# Patient Record
Sex: Female | Born: 1950
Health system: Western US, Academic
[De-identification: ages and names within clinical notes are randomized; demographics above are authoritative.]

---

## 2004-06-21 DIAGNOSIS — I10 Essential (primary) hypertension: Secondary | ICD-10-CM

## 2004-06-21 DIAGNOSIS — K769 Liver disease, unspecified: Secondary | ICD-10-CM

## 2007-10-31 DIAGNOSIS — R7303 Prediabetes: Secondary | ICD-10-CM

## 2017-01-22 MED ORDER — LISINOPRIL 10 MG PO TABS
ORAL_TABLET | 0 refills
Start: 2017-01-22 — End: ?

## 2019-10-21 DIAGNOSIS — I700 Atherosclerosis of aorta: Secondary | ICD-10-CM

## 2020-05-15 ENCOUNTER — Ambulatory Visit: Payer: PRIVATE HEALTH INSURANCE

## 2020-05-15 DIAGNOSIS — I10 Essential (primary) hypertension: Secondary | ICD-10-CM

## 2020-05-15 DIAGNOSIS — Z1211 Encounter for screening for malignant neoplasm of colon: Secondary | ICD-10-CM

## 2020-05-15 DIAGNOSIS — H04123 Dry eye syndrome of bilateral lacrimal glands: Secondary | ICD-10-CM

## 2020-05-15 DIAGNOSIS — R053 Chronic cough: Secondary | ICD-10-CM

## 2020-05-15 DIAGNOSIS — Z7689 Persons encountering health services in other specified circumstances: Secondary | ICD-10-CM

## 2020-05-15 MED ORDER — ALBUTEROL SULFATE HFA 108 (90 BASE) MCG/ACT IN AERS
1 | RESPIRATORY_TRACT | 0 refills | Status: AC | PRN
Start: 2020-05-15 — End: ?

## 2020-05-15 MED ORDER — LOSARTAN POTASSIUM 25 MG PO TABS
25 mg | ORAL_TABLET | Freq: Every day | ORAL | 0 refills | Status: AC
Start: 2020-05-15 — End: ?

## 2020-05-15 NOTE — Progress Notes
[facility]  Outpatient Follow Up/Progress Note  PMD: Beaulah Dinning., MD  05/15/2020      Chief Complaint   Patient presents with   ??? Establish Care     Requesting fasting labs.    ??? Cough     on and off; mucus; has been with her for years            SUBJECTIVE     Nina Cox is a 70 y.o. female with PMH of HTN, glaucoma who presents for establishing care    # chronic cough  - ''whole life'' off and on  - worse recently  - mucous in throat  - was previously prescribed mucinex, this didn't help  - flonase also helped, does feel dripping sensation in the back of throat  - 30 years ago was seen by a lung specialist, was taking an inhaler at the time, this helped, was told it was a form of asthma    # chronic dry eyes/glaucoma  - following with ophthalmologist, was told to use otc eye drops for dryness  - itching as well as dryness    # HTN  - on losartan 25mg  once daily  - per patient has not checked home reading recently       No other acute concerns      Review of Systems - A complete 14-system review of system was performed with pertinent positives and negatives included above and in the HPI and otherwise reviewed and found to be negative and/or non-contributory.      Past Medical History:   Diagnosis Date   ??? Glaucoma    ??? Hypertension      Past Surgical History:   Procedure Laterality Date   ??? BREAST SURGERY         Medications: reviewed medication list in the chart    Medications that the patient states to be currently taking   Medication Sig   ??? bimatoprost 0.03% ophthalmic drops Place 1 drop into the right eye .   ??? cycloSPORINE, PF, (CEQUA) 0.09 % SOLN Place 1 drop into the right eye .   ??? losartan 25 mg tablet Take 1 tablet (25 mg total) by mouth daily.   ??? [DISCONTINUED] losartan 25 mg tablet Take 1 tablet by mouth.       Allergies: Amlodipine        OBJECTIVE     BP 129/73  ~ Pulse 78  ~ Temp 36.6 ???C (97.8 ???F) (Oral)  ~ Resp 16  ~ Ht 5' 1'' (1.549 m)  ~ Wt 132 lb (59.9 kg)  ~ LMP (LMP Unknown)  ~ SpO2 97%  ~ BMI 24.94 kg/m???     General appearance - alert, well appearing, and in no distress  Eyes - extraocular eye movements intact, conjunctivae injected  Respiratory - clear to auscultation, no wheezes, rales or rhonchi, symmetric air entry  Cardiovascular - normal rate, regular rhythm, normal S1, S2, no murmurs, rubs, clicks or gallops    Labs:  No results found for this or any previous visit.      Imaging:  No results found.        ASSESSMENT AND PLAN:      Nina Cox is a 70 y.o. female who presents for   Chief Complaint   Patient presents with   ??? Establish Care     Requesting fasting labs.    ??? Cough     on and off; mucus; has been  with her for years      1. Encounter to establish care  - CBC; Future  - Comprehensive Metabolic Panel; Future  - Hgb A1c; Future  - Lipid Panel; Future  - TSH with reflex FT4, FT3; Future    2. Chronic cough  - prior CXR from [facility] reviewed (11/2019): Mild bilateral basilar scarring/subsegmental atelectasis. ??? ??? ??? ??? ??? ???   The lungs are otherwise clear. ???No pleural effusions are seen.   - likely component of PND as well as possible reactive airway  - advised flonase and neil med sinus rinses for PND and albuterol for reactive airway  - advised pt to follow up with pulmonology given chronicity  - albuterol 90 mcg/act inhaler; Inhale 1 puff every four (4) hours as needed (shortness of breath or wheezing).  Dispense: 8.5 g; Refill: 0  - Referral to Pulmonary Disease    3. Hypertension, essential  - well controlled from office reading  - advised to resume home BP monitoring as well, goal BP discussed  - losartan 25 mg tablet; Take 1 tablet (25 mg total) by mouth daily.  Dispense: 90 tablet; Refill: 0    4. Chronically dry eyes, bilateral  - Referral to Ophthalmology    5. Screening for colon cancer  - Referral for Colonoscopy Procedure            RHM (to be addressed at annual wellness visit)   Health Maintenance   Topic Date Due   ??? Colorectal Cancer Screening  Never done   ??? Shingles (Shingrix) Vaccine (1 of 2) Never done   ??? Annual Preventive Wellness Visit  Never done   ??? Advance Directive  Never done   ??? Pneumococcal Vaccine (1 of 2 - PCV13) Never done   ??? COVID-19 Vaccine(Tracks primary and booster doses, not sup/immunocomp) (3 - Booster for Pfizer series) 10/28/2019   ??? Statin prescribed for ASCVD Prevention or Treatment  Never done   ??? Influenza Vaccine (Season Ended) 09/06/2020   ??? Breast Ca Screening: MAMMOGRAM  12/06/2021   ??? Tdap/Td Vaccine (2 - Td or Tdap) 06/11/2027   ??? Hepatitis B Screening  Completed   ??? Osteoporosis Early Detection DEXA Scan  Completed   ??? Hepatitis C Screening  Completed       Vaccines:  Immunization History   Administered Date(s) Administered   ??? COVID-19, mRNA, (Pfizer - Purple Cap) 30 mcg/0.3 mL 05/07/2019, 05/28/2019   ??? Hepatitis B, unspecified formulation 07/05/2004   ??? Td 04/14/2007   ??? Tdap 06/10/2017         The above plan of care, diagnosis, orders, and follow-up were discussed with the patient.  Questions related to this recommended plan of care were answered.    Patient Instructions   Please call [phone] to schedule colonoscopy     Nasal rinses, Lloyd Huger Med sinus rinses      Return for nurse visit for blood draw, pt requesting. or sooner PRN    Future Appointments   Date Time Provider Department Center   07/27/2020  4:20 PM Frankey Shown., MD [facility]         Signed,  Ross Ludwig, MD  [facility] Clinical Instructor  Department of Medicine  [place] [facility] [place], [place]  12:41 PM 05/15/2020

## 2020-05-15 NOTE — Patient Instructions
Please call [phone] to schedule colonoscopy    Nasal rinses, Lloyd Huger Med sinus rinses

## 2020-07-27 ENCOUNTER — Ambulatory Visit: Payer: PRIVATE HEALTH INSURANCE

## 2020-07-27 DIAGNOSIS — R0982 Postnasal drip: Secondary | ICD-10-CM

## 2020-07-27 DIAGNOSIS — R918 Other nonspecific abnormal finding of lung field: Secondary | ICD-10-CM

## 2020-07-27 DIAGNOSIS — R053 Chronic cough: Secondary | ICD-10-CM

## 2020-07-27 NOTE — Consults
PULMONARY CLINIC INITIAL CONSULTATION    PATIENT: Nina Cox  MRN: [id number]  DOB: [birth year]  DATE OF SERVICE: 07/27/2020    REFERRING PROVIDER:    REASON FOR CONSULTATION:     HISTORY OF PRESENT ILLNESS:  Nina Cox is a 70 y.o. female    REVIEW OF SYSTEMS:  A 14-point system review of system was performed and abnormal findings not mentioned in the HPI are documented below (check box if abnormal and describe):  [ ]  Constitutional - fever/chills, night sweats, weight loss, fatigue [ ]  GI - abdominal pain, dysphagia, odynophagia, nausea/vomiting, diarrhea, constipation, melena or hematochezia [ ]  Musculoskeletal - arthralgias, joint swelling, myalgias, back pain   [ ]  Eyes - icterus, redness/itchiness, pain, blurry vision or loss [ ]  GU - dysuria, hematuria [ ]  Hematologic - easy bruising, bleeding, lymphadenopathy   [ ]  ENT - congestion, post-nasal drip, epistaxis, oral ulcers, hearing loss, ear pain, facial pain, hoarseness [ ]  Endocrine - polyuria, cold/heart intolerance [ ]  Neurologic - headaches, numbness, weakness, gait imbalance    [ ]  Cardiac - syncope, chest pain, palpitations, orthopnea, paroxysmal nocturnal dyspnea [ ]  Allergy/Immunology - angioedema, hives  [ ]  Psych - anxiety, depression, substance abuse, hallucinations or paranoia   [X]  Respiratory - see HPI [ ]  Skin - rashes, pruritis, nodules, dry skin  [ ]  Extremities - swelling, discoloration, pain       The patient's past medical history, medications, allergies, social history and family history were reviewed and updated on 07/27/2020 as documented below:    PAST MEDICAL HISTORY:  Past Medical History:   Diagnosis Date   ? Glaucoma    ? Hypertension        PAST SURGICAL HISTORY:  Past Surgical History:   Procedure Laterality Date   ? BREAST SURGERY         MEDICATIONS:    Current Outpatient Medications:   ?  albuterol 90 mcg/act inhaler, Inhale 1 puff every four (4) hours as needed (shortness of breath or wheezing)., Disp: 8.5 g, Rfl: 0  ? bimatoprost 0.03% ophthalmic drops, Place 1 drop into the right eye ., Disp: , Rfl:   ?  cycloSPORINE, PF, (CEQUA) 0.09 % SOLN, Place 1 drop into the right eye ., Disp: , Rfl:   ?  losartan 25 mg tablet, Take 1 tablet (25 mg total) by mouth daily., Disp: 90 tablet, Rfl: 0    ALLERGIES:  Allergies   Allergen Reactions   ? Amlodipine      Other reaction(s): Other  Edema        SOCIAL HISTORY:  Social History     Socioeconomic History   ? Marital status: Divorced   Tobacco Use   ? Smoking status: Never Smoker   ? Smokeless tobacco: Never Used   Substance and Sexual Activity   ? Alcohol use: Yes   ? Drug use: No   Other Topics Concern   ? Need Help Feeding Yourself? No   ? Need Help Getting Dressed? No   ? Need Help Using the Telephone? No   ? 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 Need Help Shopping for Groceries? No   ? Need Help Getting Places Beyond Walking Distance? (Bus, Taxi) No   ? Need Help Getting from Bed to Chair? No   ? Need Help Bathing or Showering? No   ? Need Help Taking your Medications? No   ? Need Help Doing Moderately Strenuous Housework? (ex. Laundry) No   ?  Need Help Driving? No   ? Need Help Getting to the Toilet? No   ? Need Help Walking Across the Road? (Includes [place]) No   ? Need Help Preparing Meals? No   ? Need Help Shopping for Personal Items? (Toiletries, Medicines) No   ? Need Help Climbing a Flight of Stairs? No   ? Do you live with someone who assists you at home? No   ? Do you get help from family members or friends in your home? No   ? Do you employ someone to provide health related care or help you in your home? No   ? Do you provide care for a family member? No   ? Does your home have rugs in the hallway? No   ? Does your home have poor lighting? No   ? Does your home lack grab bars in the bathroom? No   ? Does your home lack handrails on the stairs? No   ? Have you noticed any hearing difficulties? No   ? Do you currently participate in any regular activity to improve or maintain your physical fitness? Yes   ? Do you always wear a seatbelt when you ride in a car? Yes   ? If you drink alcohol, do you drink more than 7 drinks per week or more than 3 drinks on any given day? No   ? Has anyone ever been concerned about your drinking? No       FAMILY HISTORY:  Family History   Problem Relation Age of Onset   ? Coronary artery disease Father        PHYSICAL EXAMINATION:  BP 143/79  ~ Pulse 66  ~ LMP  (LMP Unknown)  ~ SpO2 96%     Sleep: Mallampati ***. Epworth Sleepiness Score: ***  General: Well-nourished/Well-developed. Appears stated age. No acute distress.    HEENT: Normocephalic/Atraumatic. Conjunctivae pink. Anicteric sclerae. PERRL, EOMI.  Oral mucosa pink and moist. Posterior pharynx without erythema, exudate or purulence.    Neck: Supple. Trachea midline, No JVD.   Thorax: Normal effort. Clear to auscultation bilaterally. No wheezes, rhonchi or crackles.  Cardiovascular: Regular rate rhythm. No murmurs/rubs/gallops. No heaves. Pulses symmetric bilaterally.  Abdomen: Soft. Bowel sounds present. Non-tender. No rigidity/guarding. No hepatosplenomegaly.  Extremities: No clubbing, cyanosis or edema.  Skin: No lesions or ulcers noted.  Lymphatic: No palpable cervical, axillary or inguinal lymph nodes. No evidence of lymphedema.  Neurologic: Alert and Oriented x 4. CN II-XII intact. Motor Strength grossly normal and symmetric throughout.   Psychiatric: Well-groomed. Mood pleasant. Affect congruent. Speech fluent.     RADIOLOGICAL STUDIES:  LDCT chest 10/03/17 - reviewed outside report:  1. ?6.6 cm soft tissue ?mass posterior to the left chest wall implant   with partial ?calcification of unclear clinical significance. ? ? ? ?   Asymmetric soft tissue ?tissue stranding surrounding the right breast   implant of unclear ?clinical significance. Further evaluation with ? ?   dedicated breast imaging ?is recommended. ? ? ? ? ? ? ? ? ? ? ? ? ? ?   2. ?Patchy densities in bilateral lower lung zones ?of unclear ? ? ? ?   clinical significance. I favor subsegmental atelectasis but ? ? ? ? ?   underlying infiltrates are difficult to exclude. ? ? ? ? ? ? ? ? ? ? ?   3. ? Atherosclerotic vascular disease. ?    CARDIAC STUDIES:  None.    PULMONARY FUNCTION TESTS:  None.    IMPRESSIONS:   Nina Cox is a 70 y.o. female    RECOMMENDATIONS:    -Will refer to ENT  -Will repeat PFT's prior to next visit  -Will repeat CT chest prior to next visit given abnormalities on prior outside imaging  -Encouraged regular physical activity  -Discussed infection prevention    Immunization History   Administered Date(s) Administered   ? COVID-19, mRNA, (Pfizer - Purple Cap) 30 mcg/0.3 mL 05/07/2019, 05/28/2019   ? Hepatitis B, unspecified formulation 07/05/2004   ? Td 04/14/2007   ? Tdap 06/10/2017       RTC in 3 months.     Previous labs, imaging, and procedures, if listed above, were reviewed.  Time spent in today?s visit was ** minutes of which at least 50% of which was in education, counseling, and care coordination.    Delana Meyer. Latanya Maudlin, M.D., M.S.  Assistant Clinical Professor  Division of Pulmonary & Critical Care Medicine  [employer] [facility]  Pager # [phone]

## 2020-08-22 ENCOUNTER — Inpatient Hospital Stay: Payer: PRIVATE HEALTH INSURANCE

## 2020-08-22 DIAGNOSIS — R918 Other nonspecific abnormal finding of lung field: Secondary | ICD-10-CM

## 2020-08-30 ENCOUNTER — Ambulatory Visit: Payer: PRIVATE HEALTH INSURANCE

## 2020-08-30 DIAGNOSIS — H401130 Primary open-angle glaucoma, bilateral, stage unspecified: Secondary | ICD-10-CM

## 2020-11-02 ENCOUNTER — Ambulatory Visit: Payer: PRIVATE HEALTH INSURANCE

## 2020-11-09 ENCOUNTER — Ambulatory Visit: Payer: PRIVATE HEALTH INSURANCE

## 2020-12-17 ENCOUNTER — Ambulatory Visit: Payer: PRIVATE HEALTH INSURANCE

## 2020-12-26 ENCOUNTER — Ambulatory Visit: Payer: PRIVATE HEALTH INSURANCE

## 2020-12-26 DIAGNOSIS — G4733 Obstructive sleep apnea (adult) (pediatric): Secondary | ICD-10-CM

## 2020-12-26 DIAGNOSIS — R197 Diarrhea, unspecified: Secondary | ICD-10-CM

## 2020-12-26 DIAGNOSIS — H40003 Preglaucoma, unspecified, bilateral: Secondary | ICD-10-CM

## 2020-12-26 DIAGNOSIS — L659 Nonscarring hair loss, unspecified: Secondary | ICD-10-CM

## 2020-12-26 NOTE — Progress Notes
[facility]  Outpatient Progress Note  PMD: Beaulah Dinning., MD  12/26/2020      Chief Complaint   Patient presents with   ? Diarrhea     Pt have diarrhea       Subjective:      Nina Cox is a 70 y.o. female with PMH of *** who presents for     (1) diarrhea  - going on for 2 mo  - has tried otc meds and hasn't improved  - some days it's bad, all day and all long, can't make it to the bathroom  - endorses mal odor  - denies blood in stool    (2). OSA?  - had plastic surg last week and was told has possible osa (mini face lift)   - stops breathing at times.   - was also told has hernia on abd, (harvested fat)     (3). Hair thinning  - worse int he past 6 mo         No other acute concerns    Objective (click to expand/collapse)     Review of Systems - A complete 14-system review of system was performed with pertinent positives and negatives included above and in the HPI and otherwise reviewed and found to be negative and/or non-contributory.      Past Medical History:   Diagnosis Date   ? Glaucoma    ? Hypertension      Past Surgical History:   Procedure Laterality Date   ? BREAST SURGERY         Medications: {meds:317282::''reviewed medication list in the chart''}  No outpatient medications have been marked as taking for the 12/26/20 encounter (Office Visit) with Sibyl Parr, DO, MS.       Allergies: Amlodipine        Objective:      BP (!) 182/84  ~ Pulse 73  ~ Temp 36.7 ?C (98 ?F) (Oral)  ~ Ht 5' 1'' (1.549 m)  ~ LMP  (LMP Unknown)  ~ SpO2 96%  ~ BMI 24.94 kg/m?     General appearance - alert, well appearing, and in no distress  Eyes - pupils equal and reactive, extraocular eye movements intact, conjunctivae not injected  Ears - bilateral TM's and external ear canals normal  Nose - normal and patent, no erythema, discharge or polyps  Mouth - mucous membranes moist, pharynx normal without lesions, uvula midline  Neck - supple, no significant adenopathy  Respiratory - clear to auscultation, no wheezes, rales or rhonchi, symmetric air entry  Cardiovascular - normal rate, regular rhythm, normal S1, S2, no murmurs, rubs, clicks or gallops.   Gastrointestinal - soft, nontender, nondistended, no masses or organomegaly  Back exam - {back exam:315940::''full range of motion, no tenderness, palpable spasm or pain on motion''}  Musculoskeletal - {msk exam:315950::''no joint tenderness, deformity or swelling''}  Extremities - peripheral pulses normal, no pedal edema, no clubbing or cyanosis  Skin - normal coloration and turgor, no rashes, no suspicious skin lesions noted  Neurological - alert, oriented, normal speech, no focal findings or movement disorder noted, normal gait   Psych - mood and affect appropriate     Labs:  No results found for this or any previous visit.      Imaging:  No results found.         ASSESSMENT and PLAN:      Nina Cox is a 70 y.o. female who presents for   Chief  Complaint   Patient presents with   ? Diarrhea     Pt have diarrhea         Problem List Items Addressed This Visit    None              RHM (to be addressed at annual wellness visit)   Health Maintenance   Topic Date Due   ? Colorectal Cancer Screening  Never done   ? Shingles (Shingrix) Vaccine (1 of 2) Never done   ? Annual Preventive Wellness Visit  Never done   ? Advance Directive  Never done   ? Pneumococcal Vaccine (1 - PCV) Never done   ? COVID-19 Vaccine(Tracks primary and booster doses, not sup/immunocomp) (3 - Booster for Pfizer series) 07/23/2019   ? Influenza Vaccine (1) Never done   ? Statin prescribed for ASCVD Prevention or Treatment  Never done   ? Breast Ca Screening: MAMMOGRAM  12/06/2021   ? Tdap/Td Vaccine (2 - Td or Tdap) 06/11/2027   ? Hepatitis B Screening  Completed   ? Osteoporosis Early Detection DEXA Scan  Completed   ? Hepatitis C Screening  Completed       Vaccines:  Immunization History   Administered Date(s) Administered   ? COVID-19, mRNA, (Pfizer - Purple Cap) 30 mcg/0.3 mL 05/07/2019, 05/28/2019   ? Hepatitis B, unspecified formulation 07/05/2004   ? Td 04/14/2007   ? Tdap 06/10/2017         The above plan of care, diagnosis, orders, and follow-up were discussed with the patient.  Questions related to this recommended plan of care were answered.        No follow-ups on file. or sooner PRN    No future appointments.      Signed,  Sibyl Parr, DO, MS  [facility] Clinical Assistant Professor, Family Medicine  Department of Medicine  [place] [facility] [place], [place]

## 2020-12-27 ENCOUNTER — Ambulatory Visit: Payer: PRIVATE HEALTH INSURANCE

## 2020-12-28 LAB — Parasite Enteric Pathogen Panel: ENTAMOEBA HISTOLYTICA PCR: NOT DETECTED

## 2020-12-29 LAB — Bacterial Enteric Pathogen Panel: SHIGA TOXIN: NOT DETECTED

## 2021-01-02 ENCOUNTER — Telehealth: Payer: PRIVATE HEALTH INSURANCE

## 2021-01-02 NOTE — Telephone Encounter
-----   Message from Sibyl Parr, DO, MS sent at 01/01/2021  6:03 PM PST -----  Please inform pt stool tests were normal.     Schedule with pcp or available  provider, if pt is still having symptoms.     Dr. Nemiah Commander

## 2021-01-02 NOTE — Telephone Encounter
Called spoke to patient message was given. rossy ma

## 2021-01-16 ENCOUNTER — Ambulatory Visit: Payer: PRIVATE HEALTH INSURANCE

## 2021-01-16 DIAGNOSIS — N2889 Other specified disorders of kidney and ureter: Secondary | ICD-10-CM

## 2021-01-16 NOTE — Progress Notes
REASON FOR CONSULTATION:  Chronic cough    REFERRING PHYSICIAN:  No ref. provider found    HISTORY OF PRESENT ILLNESS:  Nina Cox is a 71 y.o. female with hypertension, glaucoma    01/25/2021:  He was previously placed on a trial of albuterol.     REVIEW OF SYSTEMS:  14 point review of systems was performed which was negative with the exception of that noted in the HPI above.    ALLERGIES:  Allergies   Allergen Reactions   ? Amlodipine      Other reaction(s): Other  Edema        Patient Active Problem List   Diagnosis   ? Essential hypertension   ? Hyperlipidemia   ? Prediabetes   ? Nevus of choroid   ? Insufficiency, tear film   ? Chronic nonalcoholic liver disease   ? Primary open angle glaucoma (POAG) of both eyes   ? Hardening of the aorta (main artery of the heart) (HCC/RAF)   ? Abnormal tomography of chest       Past Medical History:   Diagnosis Date   ? Glaucoma    ? Hypertension        Past Surgical History:   Procedure Laterality Date   ? BREAST SURGERY         Past Surgical History:   Procedure Laterality Date   ? BREAST SURGERY         CURRENT MEDICATIONS:    Current Outpatient Medications:   ?  albuterol 90 mcg/act inhaler, Inhale 1 puff every four (4) hours as needed (shortness of breath or wheezing)., Disp: 8.5 g, Rfl: 0  ?  amoxicillin 500 mg tablet, TAKE 1 TABLET BY MOUTH THREE TIMES DAILY UNTIL GONE (Patient not taking: Reported on 01/16/2021.), Disp: , Rfl:   ?  bimatoprost 0.03% ophthalmic drops, Place 1 drop into the right eye. (Patient not taking: Reported on 01/16/2021.), Disp: , Rfl:   ?  cycloSPORINE, PF, (CEQUA) 0.09 % SOLN, Place 1 drop into the right eye . (Patient not taking: Reported on 01/16/2021.), Disp: , Rfl:   ?  LUMIGAN 0.01 % ophthalmic solution, INSTILL 1 DROP IN BOTH EYES EVERY NIGHT AT BEDTIME, Disp: , Rfl:     OUTPATIENT MEDICATIONS PRIOR TO ENCOUNTER:  Outpatient Medications Prior to Visit   Medication Sig   ? albuterol 90 mcg/act inhaler Inhale 1 puff every four (4) hours as needed (shortness of breath or wheezing).   ? amoxicillin 500 mg tablet TAKE 1 TABLET BY MOUTH THREE TIMES DAILY UNTIL GONE (Patient not taking: Reported on 01/16/2021.)   ? bimatoprost 0.03% ophthalmic drops Place 1 drop into the right eye. (Patient not taking: Reported on 01/16/2021.)   ? cycloSPORINE, PF, (CEQUA) 0.09 % SOLN Place 1 drop into the right eye . (Patient not taking: Reported on 01/16/2021.)   ? LUMIGAN 0.01 % ophthalmic solution INSTILL 1 DROP IN BOTH EYES EVERY NIGHT AT BEDTIME     No facility-administered medications prior to visit.       CONTINUOUS INFUSIONS:     SCHEDULED MEDICATIONS:     PRN MEDICATIONS:     Social History     Socioeconomic History   ? Marital status: Divorced   Tobacco Use   ? Smoking status: Never   ? Smokeless tobacco: Never   Substance and Sexual Activity   ? Alcohol use: Yes   ? Drug use: No   Other Topics Concern   ? Need Help Feeding Yourself?  No   ? Need Help Getting Dressed? No   ? Need Help Using the Telephone? No   ? 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 Need Help Shopping for Groceries? No   ? Need Help Getting Places Beyond Walking Distance? (Bus, Taxi) No   ? Need Help Getting from Bed to Chair? No   ? Need Help Bathing or Showering? No   ? Need Help Taking your Medications? No   ? Need Help Doing Moderately Strenuous Housework? (ex. Laundry) No   ? Need Help Driving? No   ? Need Help Getting to the Toilet? No   ? Need Help Walking Across the Road? (Includes [place]) No   ? Need Help Preparing Meals? No   ? Need Help Shopping for Personal Items? (Toiletries, Medicines) No   ? Need Help Climbing a Flight of Stairs? No   ? Do you live with someone who assists you at home? No   ? Do you get help from family members or friends in your home? No   ? Do you employ someone to provide health related care or help you in your home? No   ? Do you provide care for a family member? No   ? Does your home have rugs in the hallway? No   ? Does your home have poor lighting? No   ? Does your home lack grab bars in the bathroom? No   ? Does your home lack handrails on the stairs? No   ? Have you noticed any hearing difficulties? No   ? Do you currently participate in any regular activity to improve or maintain your physical fitness? Yes   ? Do you always wear a seatbelt when you ride in a car? Yes   ? If you drink alcohol, do you drink more than 7 drinks per week or more than 3 drinks on any given day? No   ? Has anyone ever been concerned about your drinking? No       Family History   Problem Relation Age of Onset   ? Coronary artery disease Father        Family Status   Relation Name Status   ? Father  (Not Specified)       PHYSICAL EXAMINATION:  There were no vitals filed for this visit.  There is no height or weight on file to calculate BMI.  There were no vitals filed for this visit.  There were no vitals filed for this visit.  Constitutional: Well developed, no acute distress  Psychiatric: Normal mood, oriented to person/place/time  Eyes: Lids/Conjunctivae normal & anicteric, pupils equally round and reactive to light  Ears/Nose/Mouth/Throat: Lips/teeth/gums normal, oropharynx normal  Neck: Neck midline, no crepitus, no masses; thyroid not tender or enlarged  Respiratory: No retractions or use of accessory muscles of respiration, clear to auscultation bilaterally  Cardiovascular: Regular rate rhythm, no murmurs/rubs/gallops. No pitting edema  Gastrointestinal: Soft, nontender, not distended, no palpable masses  Lymphatics: No cervical lymphadenopathy, no axillary lymphadenopathy  Musculoskeletal: No clubbing/cyanosis, muscle strength/tone normal    LABORATORY STUDIES:  No results found for: WBC, HGB, HCT, MCV, PLT  No results found for: NA, K, CL, CO2, BUN, CREAT, GLUCOSE  No results found for: ICALCOR, CALCIUM, PHOS  No results found for: ALT, AST, GGT, ALKPHOS, BILITOT, BILICON, AMMONIA  No results found for: LDH  No results found for: AMYLASE, LIPASE  No results found for: INR, PT, APTT  No  results found for: TSH, T3TOTAL, T4TOTAL  No results found for: PHART, PCO2ART, PO2ART, BICARBART, BEART   No results found for: PHVEN, PCO2VEN, PO2VEN, BICARBVEN, BEVEN   No results found for: LACTATE    BNP LEVELS:  No results found for: BNP    TROPONIN LEVELS:  No results found for: TROPONIN    FERRITIN LEVELS:  No results found for: FERRITIN    EOSINOPHIL COUNTS:  No results found for: EOSABS, EOSPCT    SEROLOGICAL STUDIES:  No results found for: IGE  No results found for: TSH    INFECTIOUS SEROLOGIES:  No results found for: MTBQFNGOLD, COCCIABCF, COCCIABIGG, COCCIABIGM, CRYPTAGBLD, CRYPTAGTRBLD, ASPERGAGEIA    RHEUMATOLOGICAL SEROLOGIES:  No results found for: ANAAB, ANAABTITER, DSDNAAB, NDNAABIFA, SSAAB, SSBAB, SMAB, RNPAB, RHEUMFAC, CCPAB, CENTROBAB, SCL70AB, PANCA, MPOABBF, CANCA, PROT3ABBF, CKTOT, ALDOLASE, GBMIGG    No results found for: CARDLIPIGA, CARDLIPIGG, CARDLIPIGM    Jo 1 antibody:   Angiotensin converting enzyme: U/L    HYPERSENSITIVITY PNEUMONITIS PANEL:  Hypersensitivity pneumonitis panel:     IMMUNOGLOBULIN STUDIES:  No results found for: IGGSER, IGMSER, IGASER, IGE    IgG Subclass 1: mg/dL  IgG Subclass 2: mg/dL  IgG Subclass 3: mg/dL  IgG Subclass 4: mg/dL    VITAMIN D LEVELS:  No results found for: VITD25OH, VITD25OH, VITD25OH    MICROBIOLOGICAL STUDIES:  No results found for: BACULBLD, BACULRSP, BACULUR, UABACTERIA, BACULST, FUCUL, FUCULBLD, FUCULRSP, FUCULUR    No results found for: EBVQPCRALPHA, EBVQPCR, CMVQPCRALPHA, CMVQPCR    COVID-19 STUDIES:  No results found for: COVID19QLPCR, COVID19QLPCR    No results found for: COVIDQLIGG      RADIOLOGICAL STUDIES:    CAT SCANS       Recent Results (from the past 365 days)    CT chest wo contrast [IMG200]    Status: Normal 08/24/2020  1:28 PM    Narrative  CT CHEST WO CONTRAST    CLINICAL HISTORY: Scan request unrelated to lung cancer, chronic cough, prior imaging with infiltrates.    COMPARISON: None.    TECHNIQUE: On a multirow-detector CT scanner, a noncontrast chest CT was performed. Multiplanar reformations were performed.    RADIATION DOSE: CTDI = 8.91 mGy. DLP = 293.1 mGy*cm.    FINDINGS:    Chest wall: Bilateral breast prostheses, peripherally calcified. An ovoid soft tissue nodule measuring 15 x 68 mm with internal scattered calcifications posterior to the left silicone implant (7-52).    Lungs: Nodularity along the tracheal wall. Calcified micronodule (2-178) the remnant of granulomatous disease. Subpleural ground glass opacity, bandlike (3-176). Bibasilar atelectasis. No pneumonia or consolidation    Lymph nodes: No mediastinal, hilar, or axillary lymphadenopathy.    Pleura: No pleural effusions or thickening.    Cardiovascular: Normal heart size. Normal pericardium. No pericardial effusion. Thoracic aorta and pulmonary trunk within normal caliber. Aorta with calcifications.    Osseous: . Prominent degenerative changes along the thoracic spine and No suspicious osseous lesions.    Upper abdomen: Left renal low-density 16 mm lesion (3-286) is difficult to characterize due to lack of contrast on this study.    Impression  1. No pneumonia or pulmonary edema. Linear areas of scarring versus subsegmental atelectasis in the bilateral lower lobes.  2. Indeterminate left renal lesion 16 mm in size, recommend correlation with dedicated renal ultrasound    Dictated by: Ginnie Smart   08/23/2020 11:17 AM    Signed by: Porfirio Mylar   08/24/2020 1:28 PM      08/22/2020 CT chest wo  contrast  1. No pneumonia or pulmonary edema. Linear areas of scarring versus subsegmental atelectasis in the bilateral lower lobes.  2. Indeterminate left renal lesion 16 mm in size, recommend correlation with dedicated renal ultrasound    10/03/2017 CT chest low dose [facility])  1. ?6.6 cm soft tissue ?mass posterior to the left chest wall implant   with partial ?calcification of unclear clinical significance. ? ? ? ?   Asymmetric soft tissue ?tissue stranding surrounding the right breast   implant of unclear ?clinical significance. Further evaluation with ? ?   dedicated breast imaging ?is recommended. ? ? ? ? ? ? ? ? ? ? ? ? ? ?   2. ?Patchy densities in bilateral lower lung zones ?of unclear ? ? ? ?   clinical significance. I favor subsegmental atelectasis but ? ? ? ? ?   underlying infiltrates are difficult to exclude. ? ? ? ? ? ? ? ? ? ? ?   3. ? Atherosclerotic vascular disease. ? ? ?       CHEST X-RAYS  No CXR found in the last 365 days    CT chest wo contrast    Result Date: 08/24/2020  IMPRESSION: 1. No pneumonia or pulmonary edema. Linear areas of scarring versus subsegmental atelectasis in the bilateral lower lobes. 2. Indeterminate left renal lesion 16 mm in size, recommend correlation with dedicated renal ultrasound Dictated by: Ginnie Smart   08/23/2020 11:17 AM Signed by: Porfirio Mylar   08/24/2020 1:28 PM      No CT Brain in the last 365 days  No MRI Brain in the last year  No MRA Brain in the last 365 days  No MRV Brain in the last 365 days    CARDIAC STUDIES:  No results found.    CARDIAC CATHETERIZATIONS:  None    SLEEP STUDIES:  None    COMPLIANCE REPORTS:  None    PULMONARY FUNCTION TESTS:  PFT Results    No lab values to display.         None    METHACHOLINE CHALLENGE TESTS:    None    SIX MINUTE WALK TESTS: (ideal walk distance feet)  There were no vitals filed for this visit.    None    IMPRESSIONS/RECOMMENDATIONS:  Nina Cox is a 71 y.o. female with hypertension, glaucoma    1. Chronic cough. 08/22/2020 CT chest did not suggest etiology for cough  - PFT scheduled for 02/06/2021  - Recommend methacholine challenge test  - Recommend ENT referral    More than 50% of the face to face time of this visit was devoted to counseling/coordination of care.

## 2021-01-16 NOTE — Patient Instructions
Please call [phone] to schedule ultrasound

## 2021-01-16 NOTE — Progress Notes
[facility]  Outpatient Follow Up/Progress Note  PMD: Beaulah Dinning., MD  01/16/2021      Chief Complaint   Patient presents with   ? Cough     On going cough;            SUBJECTIVE     Nina Cox is a 71 y.o. female with PMH of HTN, glaucoma who presents for     # chronic cough  - per pt ongoing  - albuterol and flonase minimally helpful  - was last seen on 07/27/20 by pulmonology: completed CT chest, PFTs ordered (not completed), ENT referral placed (not seen as yet)  - would like to review CT chest results     No other acute concerns      Review of Systems - A complete 14-system review of system was performed with pertinent positives and negatives included above and in the HPI and otherwise reviewed and found to be negative and/or non-contributory.      Past Medical History:   Diagnosis Date   ? Glaucoma    ? Hypertension      Past Surgical History:   Procedure Laterality Date   ? BREAST SURGERY         Medications: reviewed medication list in the chart    Medications that the patient states to be currently taking   Medication Sig   ? albuterol 90 mcg/act inhaler Inhale 1 puff every four (4) hours as needed (shortness of breath or wheezing).   ? LUMIGAN 0.01 % ophthalmic solution INSTILL 1 DROP IN BOTH EYES EVERY NIGHT AT BEDTIME       Allergies: Amlodipine        OBJECTIVE     BP 171/81  ~ Pulse 69  ~ Resp 16  ~ LMP  (LMP Unknown)  ~ SpO2 98%     General appearance - alert, and in no distress  Eyes - extraocular eye movements intact, conjunctivae injected  Neurological - alert, oriented, normal speech, normal gait   Psych - flat affect    Labs:  Results for orders placed or performed in visit on 12/26/20   Bact Enteric Pathogen Panel PCR, Stool    Specimen: Stool   Result Value Ref Range    Shigella species Not Detected Not Detected    Shiga Toxin Not Detected Not Detected    Campylobacter species Not Detected Not Detected    Salmonella species Not Detected Not Detected   Parasite Enteric Pathogen Panel    Specimen: Stool   Result Value Ref Range    Giardia lamblia Not Detected Not Detected    Cryptosporidium species Not Detected Not Detected    Entamoeba histolytica Not Detected Not Detected         Imaging:  No results found.        ASSESSMENT AND PLAN:      Nina Cox is a 71 y.o. female who presents for   Chief Complaint   Patient presents with   ? Cough     On going cough;      1. Chronic cough  - likely multifactorial: PND as well as possible reactive airway  - to continue flonase and albuterol prn  - prior pulmonology notes reviewed  - CT chest findings reviewed with patient: Linear areas of scarring versus subsegmental atelectasis in the bilateral lower lobes.  - advised to complete PFTs and follow up with ENT as well as pulm    2. Left kidney  mass  - noted on CT chest, will check [place]  - pt currently asymptomatic  - US kidney non-vascular bilat (bladder images included); Future            RHM (to be addressed at annual wellness visit)   Health Maintenance   Topic Date Due   ? Colorectal Cancer Screening  Never done   ? Shingles (Shingrix) Vaccine (1 of 2) Never done   ? Annual Preventive Wellness Visit  Never done   ? Advance Directive  Never done   ? Pneumococcal Vaccine (1 - PCV) Never done   ? COVID-19 Vaccine(Tracks primary and booster doses, not sup/immunocomp) (3 - Booster for Pfizer series) 07/23/2019   ? Influenza Vaccine (1) Never done   ? Statin prescribed for ASCVD Prevention or Treatment  Never done   ? Breast Ca Screening: MAMMOGRAM  12/06/2021   ? Tdap/Td Vaccine (2 - Td or Tdap) 06/11/2027   ? Hepatitis B Screening  Completed   ? Osteoporosis Early Detection DEXA Scan  Completed   ? Hepatitis C Screening  Completed       Vaccines:  Immunization History   Administered Date(s) Administered   ? COVID-19, mRNA, (Pfizer - Purple Cap) 30 mcg/0.3 mL 05/07/2019, 05/28/2019   ? Hepatitis B, unspecified formulation 07/05/2004   ? Td 04/14/2007   ? Tdap 06/10/2017         The above plan of care, diagnosis, orders, and follow-up were discussed with the patient.  Questions related to this recommended plan of care were answered.    There are no Patient Instructions on file for this visit.    No follow-ups on file. or sooner PRN    No future appointments.      Signed,  Ross Ludwig, MD  [facility] Clinical Instructor  Department of Medicine  [place] [facility] [place], [place]  3:00 [place] 01/16/2021

## 2021-01-17 DIAGNOSIS — R053 Chronic cough: Secondary | ICD-10-CM

## 2021-01-24 ENCOUNTER — Telehealth: Payer: PRIVATE HEALTH INSURANCE

## 2021-01-25 ENCOUNTER — Ambulatory Visit: Payer: PRIVATE HEALTH INSURANCE

## 2021-01-25 NOTE — Telephone Encounter
called patient twice to reschedule MD not avilable N/A unable to LM

## 2021-02-05 ENCOUNTER — Telehealth: Payer: PRIVATE HEALTH INSURANCE

## 2021-02-05 NOTE — Telephone Encounter
PDL Call to Clinic    Reason for Call: patient needs to reschedule pft    Appointment Related?  []  Yes  [x]  No     If yes;  Date:  Time:    Call warm transferred to PDL: [x]  Yes  []  No    Call Received by Clinic Representative:    If call not answered/not accepted, call received by Patient Services Representative:  Vaughan Basta

## 2021-02-06 ENCOUNTER — Ambulatory Visit: Payer: PRIVATE HEALTH INSURANCE

## 2021-02-06 NOTE — Telephone Encounter
Reply by:  Mariela   Spoke with Pt, rescheduled appointment

## 2021-03-22 ENCOUNTER — Ambulatory Visit: Payer: PRIVATE HEALTH INSURANCE

## 2021-03-22 DIAGNOSIS — R918 Other nonspecific abnormal finding of lung field: Secondary | ICD-10-CM

## 2021-03-22 DIAGNOSIS — R053 Chronic cough: Secondary | ICD-10-CM

## 2021-03-26 ENCOUNTER — Ambulatory Visit: Payer: PRIVATE HEALTH INSURANCE

## 2021-03-26 DIAGNOSIS — Z1211 Encounter for screening for malignant neoplasm of colon: Secondary | ICD-10-CM

## 2021-03-26 DIAGNOSIS — Z1212 Encounter for screening for malignant neoplasm of rectum: Secondary | ICD-10-CM

## 2021-03-29 MED ORDER — LOSARTAN POTASSIUM 25 MG PO TABS
ORAL_TABLET | 0 refills | Status: AC
Start: 2021-03-29 — End: ?

## 2021-03-29 MED ORDER — LOSARTAN POTASSIUM 25 MG PO TABS
ORAL_TABLET | 1.00 refills | 90.00000 days
Start: 2021-03-29 — End: ?

## 2021-04-29 ENCOUNTER — Ambulatory Visit: Payer: PRIVATE HEALTH INSURANCE

## 2021-04-29 DIAGNOSIS — Z0100 Encounter for examination of eyes and vision without abnormal findings: Secondary | ICD-10-CM

## 2021-04-29 DIAGNOSIS — R053 Chronic cough: Secondary | ICD-10-CM

## 2021-04-29 DIAGNOSIS — R221 Localized swelling, mass and lump, neck: Secondary | ICD-10-CM

## 2021-04-29 DIAGNOSIS — H40003 Preglaucoma, unspecified, bilateral: Secondary | ICD-10-CM

## 2021-04-29 DIAGNOSIS — I10 Essential (primary) hypertension: Secondary | ICD-10-CM

## 2021-04-29 MED ORDER — LUMIGAN 0.01 % OP SOLN
1 [drp] | Freq: Every evening | OPHTHALMIC | 0 refills | Status: AC
Start: 2021-04-29 — End: ?

## 2021-04-29 MED ORDER — ALBUTEROL SULFATE HFA 108 (90 BASE) MCG/ACT IN AERS
1 | RESPIRATORY_TRACT | 0 refills | Status: AC | PRN
Start: 2021-04-29 — End: ?

## 2021-04-29 MED ORDER — HYDROCHLOROTHIAZIDE 12.5 MG PO TABS
12.5 mg | ORAL_TABLET | Freq: Every day | ORAL | 0 refills | Status: AC
Start: 2021-04-29 — End: ?

## 2021-04-29 MED ORDER — LOSARTAN POTASSIUM 25 MG PO TABS
25 mg | ORAL_TABLET | Freq: Every day | ORAL | 0 refills | Status: AC
Start: 2021-04-29 — End: ?

## 2021-04-29 NOTE — Progress Notes
[facility]  Outpatient Follow Up/Progress Note  PMD: Beaulah Dinning., MD  04/29/2021      Chief Complaint   Patient presents with   ? Mass     C/o R lump on neck, noticed it last week;            SUBJECTIVE     Nina Cox is a 71 y.o. female with below listed PMH who presents for     # neck mass  - right side  - noted 1 week prior, when putting on face cream  - no clear growth  - no pain  - no trouble swallowing  - no recent dental infection or sore throat  - last December had face lift  - no recent needle procedures on face    # HTN  - currently taking losartan 25mg  once daily  - no recent home blood pressure readings  - has not been taking hydrochlorothiazide 12.5mg  once daily       No other acute concerns      Review of Systems - A complete 14-system review of system was performed with pertinent positives and negatives included above and in the HPI and otherwise reviewed and found to be negative and/or non-contributory.      Past Medical History:   Diagnosis Date   ? Glaucoma    ? Hypertension      Past Surgical History:   Procedure Laterality Date   ? BREAST SURGERY         Medications: reviewed medication list in the chart    Medications that the patient states to be currently taking   Medication Sig   ? LOSARTAN 25 mg tablet TAKE 1 TABLET(25 MG) BY MOUTH DAILY   ? LUMIGAN 0.01 % ophthalmic solution    ? [DISCONTINUED] losartan 25 mg tablet Take 1 tablet (25 mg total) by mouth daily.       Allergies: Amlodipine        OBJECTIVE     BP 144/64  ~ Pulse 64  ~ Resp 16  ~ LMP  (LMP Unknown)  ~ SpO2 98%     General appearance - alert, well appearing, and in no distress  Eyes - extraocular eye movements intact, conjunctivae not injected  Neck - mobile 2cm mass between submental and supraclavicular area on R, non tender, no overlying erythema    Labs:  Results for orders placed or performed in visit on 03/22/21   PFT: Pulmonary function testing, Theda Sers   Result Value Ref Range PFT-FVC, post BD 2.45 1.91 - 3.17 L    PFT-FVC, pre BD 2.69 1.91 - 3.17 L    PFT-FVC, Ref. 2.49     PFT-FVC, LLN 1.81     PFT-FVC, pre BD, % Ref. 108.0 % %    PFT-FVC, post BD, % Ref. 98.4 % %    PFT-FVC, change post BD, % pre -8.9 % %    PFT-FEV1, post BD 1.90 1.41 - 2.48 L    PFT-FEV1, pre BD 1.94 1.41 - 2.48 L    PFT-FEV1, Ref. 1.94     PFT-FEV1, LLN 1.41     PFT-FEV1, pre BD, % Ref. 100.0 % %    PFT-FEV1, post BD, Z score -0.12     PFT-FEV1, post BD, % Ref. 98.0 % %    PFT-FEV1, change post BD, % pre -2.0 % %    PFT-FEV1/FVC, post BD 77.74 67.31 - 85.94 %    PFT-FEV1/FVC, pre  BD 72.25 67.31 - 85.94 %    PFT-FEV1/FVC, Ref. 79     PFT-FEV1/FVC, LLN 65     PFT-FEF25-75%, pre BD 1.17 0.57 - 2.94 L/s    PFT-FEF25-75%, Ref. 1.71     PFT-FEF25-75%, LLN 0.78     PFT-FEF25-75%, pre BD, % Ref. 68.8 % %    PFT-FEF25-75%, post BD, % Ref. 89.0 % %    PFT-FEF25-75%, change post BD, % pre 29.3 % %    PFT-t exp, post BD 9.10 sec    PFT-t exp, pre BD 11.62 sec    PFT-DLCO, pre BD 19.12 12.92 - 23.40 ml/(min*mmHg)    PFT-DLCO, pre BD, Z score 0.59     PFT-DLCOhb, pre BD 19.36 12.92 - 23.40 ml/(min*mmHg)    PFT-DLCOhb, pre BD, Z score 0.65     PFT-Test start 03/22/2021  2:59 PM       PFT-Interpretation date 03/25/2021  5:50 PM       PFT-Notes       Good patient effort. ATS criteria met for all aspects of testing except DLCO due to pt unable to perform inhalation maneuver. Multiple attempts conducted, best efforts reported and repeatable.  No recent Hgb values or lab drawn from the previous two month prior to testing,using default hgb values.  Provided pt with 4x puff Albuterol inhaler with spacer per bronchodilator protocol. No adverse effects noted.   Pre- bronchodilator: SpO2 96% / 72 bpm and Post- bronchodilator: SpO2 97% / 75 bpm     Noted, Pls be advised: FVC- sub-optimal blast. Pt had difficulty performing Spirometry SOT/ EOT due to serial coughing throughout PFT.         PFT-Interpretation       Normal spirometry without significant bronchodilator response. Diffusion capacity is normal. Normal PFT. Clinical correlation is recommended         Imaging:  No results found.        ASSESSMENT AND PLAN:      Nina Cox is a 71 y.o. female who presents for   Chief Complaint   Patient presents with   ? Mass     C/o R lump on neck, noticed it last week;      1. Neck mass  - can check [place] for further clarification  - exam consistent with LN  - [place] head and neck soft tissue; Future    2. Hypertension, essential  - not at goal, advised to restart hydrochlorothiazide 12.5mg , continue losartan 25mg  once daily  - hydroCHLOROthiazide 12.5 MG tablet; Take 1 tablet (12.5 mg total) by mouth daily.  Dispense: 90 tablet; Refill: 0  - losartan 25 mg tablet; Take 1 tablet (25 mg total) by mouth daily.  Dispense: 90 tablet; Refill: 0    3. Chronic cough  - medication refill  - albuterol 90 mcg/act inhaler; Inhale 1 puff every four (4) hours as needed (shortness of breath or wheezing).  Dispense: 8.5 g; Refill: 0    4. Routine eye exam  - Referral to Ophthalmology    5. Glaucoma suspect of both eyes  - LUMIGAN 0.01 % ophthalmic solution; Place 1 drop into both eyes at bedtime.  Dispense: 7.5 mL; Refill: 0          RHM (to be addressed at annual wellness visit)   Health Maintenance   Topic Date Due   ? Colorectal Cancer Screening  Never done   ? Shingles (Shingrix) Vaccine (1 of 2) Never done   ? Annual Preventive Wellness Visit  Never done   ? Advance Directive  Never done   ? Pneumococcal Vaccine (1 - PCV) Never done   ? COVID-19 Vaccine(Tracks primary and booster doses, not sup/immunocomp) (3 - Booster for Pfizer series) 07/23/2019   ? Statin prescribed for ASCVD Prevention or Treatment  Never done   ? Influenza Vaccine (Season Ended) 09/06/2021   ? Breast Ca Screening: MAMMOGRAM  12/06/2021   ? Tdap/Td Vaccine (2 - Td or Tdap) 06/11/2027   ? Hepatitis B Screening  Completed   ? Osteoporosis Early Detection DEXA Scan  Completed   ? Hepatitis C Screening Completed       Vaccines:  Immunization History   Administered Date(s) Administered   ? COVID-19, mRNA, (Pfizer - Purple Cap) 30 mcg/0.3 mL 05/07/2019, 05/28/2019   ? Hepatitis B, unspecified formulation 07/05/2004   ? Td 04/14/2007   ? Tdap 06/10/2017         The above plan of care, diagnosis, orders, and follow-up were discussed with the patient.  Questions related to this recommended plan of care were answered.    There are no Patient Instructions on file for this visit.    No follow-ups on file. or sooner PRN    No future appointments.      Signed,  Ross Ludwig, MD  Assistant Clinical Professor  Department of Medicine  [place] [facility] [place], [place]  11:46 AM 04/29/2021

## 2021-04-29 NOTE — Patient Instructions
Please call [phone] to schedule neck ultrasound

## 2021-05-28 ENCOUNTER — Ambulatory Visit: Payer: PRIVATE HEALTH INSURANCE

## 2021-05-28 DIAGNOSIS — Z23 Encounter for immunization: Secondary | ICD-10-CM

## 2021-05-28 DIAGNOSIS — Z0000 Encounter for general adult medical examination without abnormal findings: Secondary | ICD-10-CM

## 2021-05-28 MED ORDER — ZOSTER VAC RECOMB ADJUVANTED 50 MCG/0.5ML IM SUSR
1 refills
Start: 2021-05-28 — End: ?

## 2021-06-22 MED ORDER — ALBUTEROL SULFATE HFA 108 (90 BASE) MCG/ACT IN AERS
0 refills
Start: 2021-06-22 — End: ?

## 2021-06-25 MED ORDER — ALBUTEROL SULFATE HFA 108 (90 BASE) MCG/ACT IN AERS
2 refills | Status: AC
Start: 2021-06-25 — End: ?

## 2021-07-31 ENCOUNTER — Ambulatory Visit: Payer: PRIVATE HEALTH INSURANCE

## 2021-08-05 MED ORDER — HYDROCHLOROTHIAZIDE 12.5 MG PO TABS
12.5 mg | ORAL_TABLET | Freq: Every day | ORAL | 0 refills | Status: AC
Start: 2021-08-05 — End: ?

## 2021-08-05 MED ORDER — LOSARTAN POTASSIUM 25 MG PO TABS
25 mg | ORAL_TABLET | Freq: Every day | ORAL | 0 refills | Status: AC
Start: 2021-08-05 — End: ?

## 2021-08-08 ENCOUNTER — Telehealth: Payer: PRIVATE HEALTH INSURANCE

## 2021-08-08 NOTE — Telephone Encounter
Returned patient message about scheduling routine eye exam.    Stated to the patient that we only have medically trained optometrist.     Patient refused to be seen by an OPTOMETRIST and insisting in seeing an OPHTHALMOLOGIST.     Provided alternative options for her to see if she can get an appt with Ophthalmologist.

## 2021-09-30 ENCOUNTER — Telehealth: Payer: PRIVATE HEALTH INSURANCE

## 2021-09-30 NOTE — Telephone Encounter
Appointment Accommodation Request      Appointment Type: pre op    Reason for sooner request:  pre op, Sx date: 10/11/2021, Surgeion: Dr Billie Lade - breast implant change and lift, anesthesia: unknown.    Date/Time Requested (If any):  As soon as possible.     Last seen by MD: 4/24/223     Any Symptoms:  []  Yes  [x]  No       If yes, what symptoms are you experiencing:   o Duration of symptoms (how long):     Patient or caller was offered an appointment but declined.    Patient or caller was advised to seek emergency services if conditions are urgent or emergent.    Patient or caller has been notified of the turnaround time of 1-2 business (days).

## 2021-10-01 ENCOUNTER — Telehealth: Payer: PRIVATE HEALTH INSURANCE

## 2021-10-01 NOTE — Telephone Encounter
Spoke with patient  Patient expressed frustration because she had to cancel her procedure appointment due to [place] not having sooner available appointments.     Patient is scheduled in [facility] for pre op on 10/22/21. She will call surgeons office to reschedule her surgery.    Thank you,  Corliss Parish

## 2021-10-01 NOTE — Telephone Encounter
Left VM that MD has no available pre op appointment before 10/6. I did recommended to call our [facility] location since they may have available appointments.

## 2021-10-01 NOTE — Telephone Encounter
Call Back Request      Reason for call back: patient requesting call back from Dr. Reece Levy  No information was provided as to why for call back, patient sounded very upset with insurance.    Any Symptoms:  []  Yes  [x]  No       If yes, what symptoms are you experiencing:    o Duration of symptoms (how long):    o Have you taken medication for symptoms (OTC or Rx):      If call was taken outside of clinic hours:    [] Patient or caller has been notified that this message was sent outside of normal clinic hours.     [] Patient or caller has been warm transferred to the physician's answering service. If applicable, patient or caller informed to please call us back if symptoms progress.  Patient or caller has been notified of the turnaround time of 1-2 business day(s).

## 2021-10-21 ENCOUNTER — Telehealth: Payer: PRIVATE HEALTH INSURANCE

## 2021-10-21 NOTE — Telephone Encounter
Call Back Request      Reason for call back: pt wanted to know if you needed to order any morre test for her appt on 10/24 if you could order them so she can do them tomorrow at her preop  Any Symptoms:  []  Yes  [x]  No       If yes, what symptoms are you experiencing:    o Duration of symptoms (how long):    o Have you taken medication for symptoms (OTC or Rx):      If call was taken outside of clinic hours:    [] Patient or caller has been notified that this message was sent outside of normal clinic hours.     [] Patient or caller has been warm transferred to the physician's answering service. If applicable, patient or caller informed to please call us back if symptoms progress.  Patient or caller has been notified of the turnaround time of 1-2 business day(s).

## 2021-10-22 ENCOUNTER — Ambulatory Visit: Payer: PRIVATE HEALTH INSURANCE

## 2021-10-22 ENCOUNTER — Institutional Professional Consult (permissible substitution): Payer: PRIVATE HEALTH INSURANCE | Attending: Student in an Organized Health Care Education/Training Program

## 2021-10-22 DIAGNOSIS — Z01818 Encounter for other preprocedural examination: Secondary | ICD-10-CM

## 2021-10-22 DIAGNOSIS — R9431 Abnormal electrocardiogram [ECG] [EKG]: Secondary | ICD-10-CM

## 2021-10-22 DIAGNOSIS — Z0000 Encounter for general adult medical examination without abnormal findings: Secondary | ICD-10-CM

## 2021-10-22 DIAGNOSIS — I10 Essential (primary) hypertension: Secondary | ICD-10-CM

## 2021-10-22 MED ORDER — LIDOCAINE 5 % EX OINT
Freq: Once | TOPICAL | 0 refills | Status: AC
Start: 2021-10-22 — End: ?

## 2021-10-22 MED ORDER — FLUCONAZOLE 150 MG PO TABS
150 mg | ORAL_TABLET | Freq: Once | ORAL | 0 refills | Status: AC
Start: 2021-10-22 — End: 2021-10-23

## 2021-10-22 NOTE — Telephone Encounter
Sure, orders placed

## 2021-10-22 NOTE — Patient Instructions
1 week before surgery: avoid taking herbal supplementations, Ibuprofen, Aspirin, fish oil       Tylenol is OK

## 2021-10-22 NOTE — Telephone Encounter
patient notified    Thank you,  Corliss Parish

## 2021-10-22 NOTE — Progress Notes
[place] [facility]  History and Physical Pre-Op Consultation  PMD: Beaulah Dinning., MD  10/22/2021    HPI:  Nina Cox is a 71 y.o. female who presents for preop exam.  No chief complaint on file.        Surgeon: Dr. Elsworth Soho  Planned surgery:  Cosmetic Surgery  Planned surgery date: 11/05/21  Planned Anesthesia type (if known):unknown      Known cardiac or lung diseases: HTN  11/18/2019 EKG NSR    Can climb flight of stairs: yes  Chest pain: no  Shortness of breath: no    History of reaction to anesthesia: no         Past Medical History:   Diagnosis Date   ? Glaucoma    ? Hypertension      Past Surgical History:   Procedure Laterality Date   ? BREAST SURGERY         Medications: reviewed medication list in the chart  No outpatient medications have been marked as taking for the 10/22/21 encounter (Appointment) with Riccardo Dubin, Jernee Murtaugh, DO.       Allergies: Amlodipine    Review of Systems:  Review of Systems   Constitutional: Negative for chills and fever.   HENT: Negative for congestion.    Eyes: Negative for redness.   Respiratory: Negative for cough.    Cardiovascular: Negative for chest pain.   Gastrointestinal: Negative for nausea and vomiting.   Musculoskeletal: Negative for myalgias.   Neurological: Negative for headaches.       Social History:  Social History     Socioeconomic History   ? Marital status: Divorced   Tobacco Use   ? Smoking status: Never   ? Smokeless tobacco: Never   Substance and Sexual Activity   ? Alcohol use: Yes   ? Drug use: No   Other Topics Concern   ? Need Help Feeding Yourself? No   ? Need Help Getting Dressed? No   ? Need Help Using the Telephone? No   ? 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 Need Help Shopping for Groceries? No   ? Need Help Getting Places Beyond Walking Distance? (Bus, Taxi) No   ? Need Help Getting from Bed to Chair? No   ? Need Help Bathing or Showering? No   ? Need Help Taking your Medications? No   ? Need Help Doing Moderately Strenuous Housework? (ex. Laundry) No   ? Need Help Driving? No   ? Need Help Getting to the Toilet? No   ? Need Help Walking Across the Road? (Includes [place]) No   ? Need Help Preparing Meals? No   ? Need Help Shopping for Personal Items? (Toiletries, Medicines) No   ? Need Help Climbing a Flight of Stairs? No   ? Do you live with someone who assists you at home? No   ? Do you get help from family members or friends in your home? No   ? Do you employ someone to provide health related care or help you in your home? No   ? Do you provide care for a family member? No   ? Does your home have rugs in the hallway? No   ? Does your home have poor lighting? No   ? Does your home lack grab bars in the bathroom? No   ? Does your home lack handrails on the stairs? No   ? Have you noticed any hearing difficulties?  No   ? Do you currently participate in any regular activity to improve or maintain your physical fitness? Yes   ? Do you always wear a seatbelt when you ride in a car? Yes   ? If you drink alcohol, do you drink more than 7 drinks per week or more than 3 drinks on any given day? No   ? Has anyone ever been concerned about your drinking? No         Family History:    Family History   Problem Relation Age of Onset   ? Coronary artery disease Father          EXAM:  LMP  (LMP Unknown)     Physical Exam  Cardiovascular:      Rate and Rhythm: Normal rate.      Heart sounds: No murmur heard.  Pulmonary:      Breath sounds: No wheezing, rhonchi or rales.           Labs:  No visits with results within 30 Day(s) from this visit.   Latest known visit with results is:   Office Visit on 03/22/2021   Component Date Value Ref Range Status   ? PFT-FVC, post BD 03/22/2021 2.45  1.91 - 3.17 L Final   ? PFT-FVC, pre BD 03/22/2021 2.69  1.91 - 3.17 L Final   ? PFT-FVC, Ref. 03/22/2021 2.49   Final   ? PFT-FVC, LLN 03/22/2021 1.81   Final   ? PFT-FVC, pre BD, % Ref. 03/22/2021 108.0 %  % Final   ? PFT-FVC, post BD, % Ref. 03/22/2021 98.4 %  % Final   ? PFT-FVC, change post BD, % pre 03/22/2021 -8.9 %  % Final   ? PFT-FEV1, post BD 03/22/2021 1.90  1.41 - 2.48 L Final   ? PFT-FEV1, pre BD 03/22/2021 1.94  1.41 - 2.48 L Final   ? PFT-FEV1, Ref. 03/22/2021 1.94   Final   ? PFT-FEV1, LLN 03/22/2021 1.41   Final   ? PFT-FEV1, pre BD, % Ref. 03/22/2021 100.0 %  % Final   ? PFT-FEV1, post BD, Z score 03/22/2021 -0.12   Final   ? PFT-FEV1, post BD, % Ref. 03/22/2021 98.0 %  % Final   ? PFT-FEV1, change post BD, % pre 03/22/2021 -2.0 %  % Final   ? PFT-FEV1/FVC, post BD 03/22/2021 77.74  67.31 - 85.94 % Final   ? PFT-FEV1/FVC, pre BD 03/22/2021 72.25  67.31 - 85.94 % Final   ? PFT-FEV1/FVC, Ref. 03/22/2021 79   Final   ? PFT-FEV1/FVC, LLN 03/22/2021 65   Final   ? PFT-FEF25-75%, pre BD 03/22/2021 1.17  0.57 - 2.94 L/s Final   ? PFT-FEF25-75%, Ref. 03/22/2021 1.71   Final   ? PFT-FEF25-75%, LLN 03/22/2021 0.78   Final   ? PFT-FEF25-75%, pre BD, % Ref. 03/22/2021 68.8 %  % Final   ? PFT-FEF25-75%, post BD, % Ref. 03/22/2021 89.0 %  % Final   ? PFT-FEF25-75%, change post BD, % p* 03/22/2021 29.3 %  % Final   ? PFT-t exp, post BD 03/22/2021 9.10  sec Final   ? PFT-t exp, pre BD 03/22/2021 11.62  sec Final   ? PFT-DLCO, pre BD 03/22/2021 19.12  12.92 - 23.40 ml/(min*mmHg) Final   ? PFT-DLCO, pre BD, Z score 03/22/2021 0.59   Final   ? PFT-DLCOhb, pre BD 03/22/2021 19.36  12.92 - 23.40 ml/(min*mmHg) Final   ? PFT-DLCOhb, pre BD, Z score 03/22/2021 0.65  Final   ? PFT-Test start 03/22/2021    Final                    Value:03/22/2021  2:59 PM     ? PFT-Interpretation date 03/22/2021    Final                    Value:03/25/2021  5:50 PM     ? PFT-Notes 03/22/2021    Final                    Value:Good patient effort. ATS criteria met for all aspects of testing except DLCO due to pt unable to perform inhalation maneuver. Multiple attempts conducted, best efforts reported and repeatable.  No recent Hgb values or lab drawn from the previous two month prior to testing,using default hgb values.  Provided pt with 4x puff Albuterol inhaler with spacer per bronchodilator protocol. No adverse effects noted.   Pre- bronchodilator: SpO2 96% / 72 bpm and Post- bronchodilator: SpO2 97% / 75 bpm     Noted, Pls be advised: FVC- sub-optimal blast. Pt had difficulty performing Spirometry SOT/ EOT due to serial coughing throughout PFT.        ? PFT-Interpretation 03/22/2021 Normal spirometry without significant bronchodilator response. Diffusion capacity is normal. Normal PFT. Clinical correlation is recommended   Final       Imaging:  No results found.        Impression and Recommendations:      Nina Cox is a 71 y.o. female who presents for Pre-Operative History and Physical    Surgeon: Dr. Elsworth Soho  Planned surgery:  Cosmetic Surgery  Planned surgery date: 11/05/21  Planned Anesthesia type (if known):unknown    1. Preop examination  - CBC & Auto Differential; Future  - Comprehensive Metabolic Panel; Future  - ECG 12 lead  - XR chest ap (1 view); Future  - Prothrombin Time Panel; Future  - APTT; Future  - Lipid Panel; Future  - Urinalysis w/Reflex to Culture; Future  - HIV-1/2 Ag/Ab 4th Generation with Reflex Confirmation; Future  - Referral to Cardiology    2. Essential hypertension  - Referral to Cardiology    3. Hardening of the aorta (main artery of the heart) (HCC/RAF)  - Referral to Cardiology    4. Elevated blood pressure reading in office with diagnosis of hypertension  - Referral to Cardiology    5. Abnormal EKG  - Referral to Cardiology    6. Erythrocytosis, at baseline      Perioperative risk assessment for cardiovascular and pulmonary complications.  The patient is being evaluated in clinic today prior to the above proposed surgical procedure.     ? The proposed surgical procedure is non-emergent, and, according to ACC/AHA guidelines, it is considered Moderate Risk.     ? The patient has a functional capacity defined as: > or = 4 METS.     ? The patient has the following ACC/AHA-defined active cardiac conditions: none.     ? The patient has the following ACC/AHA-defined clinical risk factors for perioperative cardiovascular complications: none.    ? The patient and the proposed surgical procedure carry the following risk factors for post-operative pulmonary complications: age > 50 years*  ? general anesthesia*.    ? The patient's ASA physical status classification is: II: Mild to moderate systemic disease, medically well controlled, with no functional limitation..      The patient may proceed with the proposed surgery,  pending Cardiac clearance by Cardiologist ordered today and with consideration to any additional recommendations outlined in this consultation note. At this time, the patient?s medical conditions, management, and perioperative risk are optimized for the proposed surgery without the need for further evaluation or intervention. This assessment is supported by established guidelines from the ACC/AHA Arelia Longest et al., 2007), ACP Herma Mering et al., 2006), and ACCP Elodia Florence et al., 2007).    Perioperative recommendations:  1. The following laboratory studies, diagnostic studies, and/or referrals are indicated and/or recommended preoperatively: see my orders in [facility].    2 Medication management:  May continue Losartan, HCTZ, Albuterol PRN    3. Other perioperative recommendations:  AVOID aspirin/NSAIDs/fish oil/herbal supplements one week prior to operation.        The above plan of care, diagnosis, orders, and follow-up were discussed with the patient.  Questions related to this recommended plan of care were answered.    30 minutes were spent personally by me today on this encounter which include today's pre-visit review of the chart, obtaining appropriate history, performing an evaluation, documentation and discussion of management with details supported within the note for today's visit. The time documented was exclusive of any time spent on the separately billed procedure.    Signed,  Lanice Schwab, DO   Assistant Professor I, [employer] at [facility] [employer] [facility] [employer]  Division of Internal Medicine    [facility] [address]  [place], [place] [address]  Tel: [phone]  Fx: [phone]

## 2021-10-22 NOTE — Telephone Encounter
Hi Dr. Reece Levy,  Patient has a preop appointment today at 3pm with Dr. Paulla Fore. Pt has her annual exam appointment with you on 10/29/21, patient would like to know if you can place annual labs so that she can have them drawn  today at her appointment.    Thank you,  Corliss Parish

## 2021-10-29 ENCOUNTER — Ambulatory Visit: Payer: PRIVATE HEALTH INSURANCE

## 2021-10-29 DIAGNOSIS — Z0000 Encounter for general adult medical examination without abnormal findings: Secondary | ICD-10-CM

## 2021-10-29 DIAGNOSIS — E785 Hyperlipidemia, unspecified: Secondary | ICD-10-CM

## 2021-10-29 DIAGNOSIS — H40003 Preglaucoma, unspecified, bilateral: Secondary | ICD-10-CM

## 2021-10-29 DIAGNOSIS — Z1231 Encounter for screening mammogram for malignant neoplasm of breast: Secondary | ICD-10-CM

## 2021-10-29 MED ORDER — LOSARTAN POTASSIUM 50 MG PO TABS
50 mg | ORAL_TABLET | Freq: Every day | ORAL | 3 refills | Status: AC
Start: 2021-10-29 — End: ?

## 2021-10-29 MED ORDER — LUMIGAN 0.01 % OP SOLN
1 [drp] | Freq: Every evening | OPHTHALMIC | 1 refills | Status: AC
Start: 2021-10-29 — End: ?

## 2021-10-29 NOTE — Patient Instructions
Please call [phone] to schedule mammogram     [email or web address], for posture    Your Preventive Wellness Plan    Preventive Service Frequency     Measurement of height, weight and determination of BMI (body mass index)    Annually    Blood Pressure measurement   Annually    Vision check   Every 2 years, to include a screening for glaucoma    Breast Cancer Screening (Mammogram) Every 1-2 years. Screening is definitely indicated to age 71, and beyond age 82 if agreed to by the patient and the physician  `   Cervical Cancer Screening (Pap Smear) Every 5 years, aged 30-65 with HPV testing. For average risk women Pap Smears are not indicated after age 41. Pap smears are also not indicated routinely for women who have had a hysterectomy    Osteoporosis Screening (Bone Density Measurement) A baseline screen is recommended at age 91 and then repeat testing if risk factors exist.    Cholesterol Testing At least every 5 years; more frequently based on risk factors    Diabetes Screening At least every 5 years; more frequently based on risk factors     Colorectal Cancer Screening Annually, Fecal Immunochemical test; or  Every 10 years, Colonoscopy  (More frequent colonoscopy if high risk)    Sexually Transmitted Infections (STI?s) As necessary for those with risk factors    Depression Screening Annually    Alcohol Misuse Screening Annually    Immunizations:  Pneumococcal (Pneumonia) Vaccine      Influenza (Flu) Vaccine  Tdap (tetanus-pertussis-diphtheria) Vaccine    Shingrix (Shingles) Vaccine        Two doses of Pneumococcal vaccine at or after age 10. Pneumococcal Conjugate 13 vaccine followed at least 1 year later by Pneumococcal Polysaccharide vaccine  Annual Influenza vaccine  One adult dose of Tdap    2 doses given 2-6 months apart after age 69            It is recommended that you go to the pharmacy to get the vaccines that are recommended that are covered under Medicare Part D.  For Medicare patients all vaccines other than those for the flu, pneumonia or hepatitis B are covered under Medicare Part D. If you have a Medicare prescription drug plan (Part D), you can get coverage for any commercially available vaccine that is not covered by Part B. All Part D plans must include all commercially available vaccines on their formularies, including:  the vaccine for shingles (herpes zoster)  the vaccine for tetanus and whooping cough (Tdap)  travel vaccines  Your Part D plan will pay for the vaccination itself and for your doctor or other health care provider to give you the shot (administration). However, you will need to make sure you follow your particular plan?s rules in order for the vaccine to be covered. Before you get a vaccination, you should check coverage rules with your Part D plan and see where you should get your shot so that it will be covered for you at the lowest cost.

## 2021-10-29 NOTE — Progress Notes
[facility]    Medicare Annual Wellness Visit      PATIENT: Nina Cox  MRN: [id number]  DOB: 1950-12-26  DATE OF SERVICE: 10/29/2021  CHIEF COMPLAINT:   Chief Complaint   Patient presents with   ? Annual Exam            SUBJECTIVE       Kattya Locker is a 71 y.o. Female who presents for an Annual Wellness Visit.    No other acute concerns today      Patient Active Problem List   Diagnosis   ? Essential hypertension   ? Hyperlipidemia   ? Prediabetes   ? Nevus of choroid   ? Insufficiency, tear film   ? Chronic nonalcoholic liver disease   ? Primary open angle glaucoma (POAG) of both eyes   ? Hardening of the aorta (main artery of the heart) (HCC/RAF)   ? Abnormal tomography of chest   ? Atherosclerosis of native coronary artery of native heart       Outpatient Medications Prior to Visit   Medication Sig Dispense Refill   ? ALBUTEROL 90 mcg/act inhaler INHALE 1 PUFF BY MOUTH EVERY 4 HOURS AS NEEDED FOR SHORTNESS OF BREATH OR WHEEZING 8.5 g 2   ? lidocaine 5% ointment APPLY TOPICALLY TO THE AFFECTED AREA 1 TIME FOR 1 DOSE     ? losartan 25 mg tablet Take 1 tablet (25 mg total) by mouth daily. 100 tablet 0   ? LUMIGAN 0.01 % ophthalmic solution Place 1 drop into both eyes at bedtime. 7.5 mL 0   ? prednisoLONE acetate 1% ophthalmic suspension SHAKE LIQUID AND INSTILL 1 DROP IN BOTH EYES FOUR TIMES DAILY FOR 2 WEEKS (Patient not taking: Reported on 04/29/2021.)     ? hydroCHLOROthiazide 12.5 MG tablet Take 1 tablet (12.5 mg total) by mouth daily. (Patient not taking: Reported on 10/29/2021.) 100 tablet 0     No facility-administered medications prior to visit.       Allergies   Allergen Reactions   ? Amlodipine      Other reaction(s): Other  Edema        Past Medical History:   Diagnosis Date   ? Glaucoma    ? Hypertension        Past Surgical History:   Procedure Laterality Date   ? BREAST SURGERY         Family History   Problem Relation Age of Onset   ? Coronary artery disease Father Social History     Socioeconomic History   ? Marital status: Divorced   Tobacco Use   ? Smoking status: Never   ? Smokeless tobacco: Never   Vaping Use   ? Vaping Use: Never used   Substance and Sexual Activity   ? Alcohol use: Yes   ? Drug use: No   ? Sexual activity: Not Currently   Other Topics Concern   ? Need Help Feeding Yourself? No   ? Need Help Getting Dressed? No   ? Need Help Using the Telephone? No   ? 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 Need Help Shopping for Groceries? No   ? Need Help Getting Places Beyond Walking Distance? (Bus, Taxi) No   ? Need Help Getting from Bed to Chair? No   ? Need Help Bathing or Showering? No   ? Need Help Taking your Medications? No   ? Need Help Doing Moderately Strenuous Housework? (  ex[place]in [place]) No   ? Need Help Driving? No   ? Need Help Getting to the Toilet? No   ? Need Help Walking Across the Road? (Includes [place]) No   ? Need Help Preparing Meals? No   ? Need Help Shopping for Personal Items? (Toiletries, Medicines) No   ? Need Help Climbing a Flight of Stairs? No   ? Do you live with someone who assists you at home? No   ? Do you get help from family members or friends in your home? No   ? Do you employ someone to provide health related care or help you in your home? No   ? Do you provide care for a family member? No   ? Does your home have rugs in the hallway? No   ? Does your home have poor lighting? No   ? Does your home lack grab bars in the bathroom? No   ? Does your home lack handrails on the stairs? Yes   ? Have you noticed any hearing difficulties? No   ? Do you currently participate in any regular activity to improve or maintain your physical fitness? Yes   ? Do you always wear a seatbelt when you ride in a car? Yes   ? If you drink alcohol, do you drink more than 7 drinks per week or more than 3 drinks on any given day? No   ? Has anyone ever been concerned about your drinking? No   ? Do you exercise at least a day, 3 or more days a week? Yes   ? Types of Exercise? (List in Comments) No   ? Do you follow a special diet? No   ? Vegan? No   ? Vegetarian? No   ? Pescatarian? No   ? Lactose Free? No   ? Gluten Free? No   ? Omnivore? No       Recent Hospitalizations? No      Health Risk Assessment:  The patient has completed a Health Risk Assessment. This has been reviewed with her and has been scanned into [facility] as an attached document.    Current Medical Providers and Suppliers:  [facility] Patient Care Team:  Beaulah Dinning., MD as PCP - General (Family Medicine)  Pcp, Unassigned No Record Of, MD as PCP - Plan Assigned      Age-appropriate Screening Schedule:  The list below includes current immunization status and future screening recommendations based on patient's age. Orders for these recommended tests are listed in the plan section. The patient has been provided with a written plan.  Immunization History   Administered Date(s) Administered   ? COVID-19, mRNA, (Pfizer - Purple Cap) 30 mcg/0.3 mL 05/07/2019, 05/28/2019   ? Hepatitis B, unspecified formulation 07/05/2004   ? Td 04/14/2007   ? Tdap 06/10/2017     Health Maintenance   Topic Date Due   ? Shingles (Shingrix) Vaccine (1 of 2) Never done   ? Annual Preventive Wellness Visit  Never done   ? Advance Directive  Never done   ? Pneumococcal Vaccine (1 - PCV) Never done   ? COVID-19 Vaccine(Tracks primary and booster doses, not sup/immunocomp) (3 - Pfizer series) 07/23/2019   ? Influenza Vaccine (1) Never done   ? Breast Ca Screening: MAMMOGRAM  12/06/2021   ? Colorectal Cancer Screening  10/29/2022   ? Tdap/Td Vaccine (2 - Td or Tdap) 06/11/2027   ? Hepatitis B Screening  Completed   ?  Osteoporosis Early Detection DEXA Scan  Completed   ? Hepatitis C Screening  Completed   ? Statin prescribed for ASCVD Prevention or Treatment  Completed         Most Recent PHQ-2 Verbal Questionnaire:Negative (10/29/21 1629)  If PHQ-2/PHQ-9 Score is blank, then no PHQ-9 screen was performed.     Most Recent PHQ-2 Score: 0 (10/29/21 1629)    Most Recent PHQ-9 Score:          05/15/2020 10/29/2021   Depression Screening (Patient Health Questionnaire PHQ)   PHQ-2: Feeling down, depressed, or hopeless No Not at all   PHQ-2: Little interest or pleassure in doing things No Not at all        PHQ-9 Score   Depression Severity     Proposed Treatment Actions    (reference)      0  -  4 None - minimal  ? None     5  -  9  Mild  ? Watchful waiting; repeat PHQ-9 at follow-up    10 - 14  Moderate  ? Treatment plan, considering counseling, follow-up and/or pharmacotherapy    15 - 19  Moderately Severe  ? Active treatment with pharmacotherapy and/or psychotherapy    20 - 27  Severe  ? Immediate initiation of pharmacotherapy and, if severe impairment or poor response to therapy, expedited referral to a mental health specialist for psychotherapy and/or collaborative management            Functional Ability/Safety Screen:  How to Perform the ''Get up and Go'' Test    Was the patient's timed ''Get Up and Go Test'' unsteady or longer than 30 sec?  No      Advanced Care Planning:  Patient has executed an Advance Directive: No: Patient was given the opportunity to execute an Advance Directive today?  No    Cognitive Assessment:  Mini-Cog: 5/5  Does the patient have evidence of cognitive impairment? No  The patient does not have any evidence of any cognitive problems and denies any  change in mood/affect, appearance, speech, memory or motor skills.        OBJECTIVE       Vitals:    10/29/21 1630   BP: 163/70   Pulse: 60   Resp: 16   Temp: 36.6 ?C (97.9 ?F)   TempSrc: Oral   SpO2: 96%   Weight: 124 lb (56.2 kg)   Height: 4' 11.84'' (1.52 m)     Body mass index is 24.34 kg/m?Marland Kitchen    General appearance - alert, well appearing, and in no distress  Eyes - extraocular eye movements intact, conjunctivae not injected  Ears - bilateral TM's and external ear canals normal  Neck - supple, no significant adenopathy  Lymphatics - no palpable lymphadenopathy, no hepatosplenomegaly  Respiratory - clear to auscultation, no wheezes, rales or rhonchi, symmetric air entry  Cardiovascular - normal rate, regular rhythm, normal S1, S2, no murmurs, rubs, clicks or gallops.  Gastrointestinal - soft, nontender, nondistended, no masses or organomegaly  Extremities - peripheral pulses normal, no pedal edema, no clubbing or cyanosis  Neurological - alert, oriented, normal speech, no focal findings or movement disorder noted, normal gait   Psych - normal mood, flat affect    No data recorded         ASSESSMENT AND PLAN     1. Medicare annual wellness visit, subsequent  - labs previously ordered, results reviewed with pt    2. Hardening of the  aorta (main artery of the heart) (HCC/RAF)  - continue blood pressure control as below  - continue lipitor 10mg  once daily    3. Essential hypertension  - not at goal, increased losartan to 50mg  once daily  - losartan 50 mg tablet; Take 1 tablet (50 mg total) by mouth daily.  Dispense: 90 tablet; Refill: 3    4. Glaucoma suspect of both eyes  - med refilled  - LUMIGAN 0.01 % ophthalmic solution; Place 1 drop into both eyes at bedtime.  Dispense: 7.5 mL; Refill: 1    5. Encounter for screening mammogram for breast cancer  - Mammo tomosynthesis, screening, bilat breast; Future          Health Maintenance   Topic Date Due   ? Shingles (Shingrix) Vaccine (1 of 2) Never done   ? Annual Preventive Wellness Visit  Never done   ? Advance Directive  Never done   ? Pneumococcal Vaccine (1 - PCV) Never done   ? COVID-19 Vaccine(Tracks primary and booster doses, not sup/immunocomp) (3 - Pfizer series) 07/23/2019   ? Influenza Vaccine (1) Never done   ? Breast Ca Screening: MAMMOGRAM  12/06/2021   ? Colorectal Cancer Screening  10/29/2022   ? Tdap/Td Vaccine (2 - Td or Tdap) 06/11/2027   ? Hepatitis B Screening  Completed   ? Osteoporosis Early Detection DEXA Scan  Completed   ? Hepatitis C Screening Completed   ? Statin prescribed for ASCVD Prevention or Treatment  Completed             Vaccines  Immunization History   Administered Date(s) Administered   ? COVID-19, mRNA, (Pfizer - Purple Cap) 30 mcg/0.3 mL 05/07/2019, 05/28/2019   ? Hepatitis B, unspecified formulation 07/05/2004   ? Td 04/14/2007   ? Tdap 06/10/2017         Patient Self-Management and Personalized Health Advice  The patient has been provided with information about:  Patient Instructions     Please call [phone] to schedule mammogram     [email or web address], for posture    Your Preventive Wellness Plan    Preventive Service Frequency     Measurement of height, weight and determination of BMI (body mass index)    ? Annually    Blood Pressure measurement   ? Annually    Vision check   ? Every 2 years, to include a screening for glaucoma    Breast Cancer Screening (Mammogram) ? Every 1-2 years. Screening is definitely indicated to age 75, and beyond age 53 if agreed to by the patient and the physician  `   Cervical Cancer Screening (Pap Smear) ? Every 5 years, aged 62-65 with HPV testing. For average risk women Pap Smears are not indicated after age 47. Pap smears are also not indicated routinely for women who have had a hysterectomy    Osteoporosis Screening (Bone Density Measurement) ? A baseline screen is recommended at age 23 and then repeat testing if risk factors exist.    Cholesterol Testing ? At least every 5 years; more frequently based on risk factors    Diabetes Screening ? At least every 5 years; more frequently based on risk factors     Colorectal Cancer Screening ? Annually, Fecal Immunochemical test; or  ? Every 10 years, Colonoscopy  (More frequent colonoscopy if high risk)    Sexually Transmitted Infections (STI?s) As necessary for those with risk factors    Depression Screening ? Annually    Alcohol Misuse Screening ?  Annually    Immunizations:  ? Pneumococcal (Pneumonia) Vaccine      ? Influenza (Flu) Vaccine  ? Tdap (tetanus-pertussis-diphtheria) Vaccine    ? Shingrix (Shingles) Vaccine        ? Two doses of Pneumococcal vaccine at or after age 26. Pneumococcal Conjugate 13 vaccine followed at least 1 year later by Pneumococcal Polysaccharide vaccine  ? Annual Influenza vaccine  ? One adult dose of Tdap    ? 2 doses given 2-6 months apart after age 20            It is recommended that you go to the pharmacy to get the vaccines that are recommended that are covered under Medicare Part D.  For Medicare patients all vaccines other than those for the flu, pneumonia or hepatitis B are covered under Medicare Part D. If you have a Medicare prescription drug plan (Part D), you can get coverage for any commercially available vaccine that is not covered by Part B. All Part D plans must include all commercially available vaccines on their formularies, including:  ? the vaccine for shingles (herpes zoster)  ? the vaccine for tetanus and whooping cough (Tdap)  ? travel vaccines  Your Part D plan will pay for the vaccination itself and for your doctor or other health care provider to give you the shot (administration). However, you will need to make sure you follow your particular plan?s rules in order for the vaccine to be covered. Before you get a vaccination, you should check coverage rules with your Part D plan and see where you should get your shot so that it will be covered for you at the lowest cost.       During the course of the visit the patient was educated and counseled about appropriate screening and preventive services including:  [IMPORTANT:  In addition to using checkbox(es) to document discussion, you must also place the appropriate order.]    []  Pneumococcal conjugate vaccine 13 (Prevnar 13)  []  Pneumococcal polysaccharide vaccine 23 (Pneumovax 23)   []  Influenza vaccine  []  Hepatitis B vaccine series  []  Td vaccine  []  Tdap vaccine  []  Shingrix vaccine (Herpes Zoster)  [x]  Screen mammography, referral place  []  Screening pap smear and pelvic exam    []  Screening electrocardiogram  []  Bone densitometry screening  []  Colorectal cancer screening, colonoscopy referral placed  [x]  Diabetes screening, see lab orders   []  Glaucoma screening recommended  []  Nutrition counseling provided  []  Exercise counseling provided  []  Smoking cessation counseling completed  []  Alcohol use counseling completed  []  Advanced directives: given [place] pamphlet to take home    []  Counseling for cardiovascular disease risk reduction  []  Urinary Incontinence assessment done  [x]  Fall Risk assessment done  []  Fall Risk plan of care done      Discussed the patient's BMI with her. The BMI is in the acceptable range    Orders placed during this encounter include:  Orders Placed This Encounter   ? Mammo tomosynthesis, screening, bilat breast     Standing Status:   Future     Standing Expiration Date:   12/30/2022     Scheduling Instructions:      [facility]:      Please call us at [phone] to schedule your appointment, we are open M-F 7am to 7pm. Please visit [place] at [email or web address] for locations, hours, services and more.      Order Specific Question:   Reason for exam:  Answer:   screening     Order Specific Question:   Order Panel?     Answer:   Yes     Order Specific Question:   I authorize the Radiologist to modify the parameters of this test as medically necessary based on the clinical indications for the study. This includes the administration of contrast.     Answer:   Yes   ? lidocaine 5% ointment     Sig: APPLY TOPICALLY TO THE AFFECTED AREA 1 TIME FOR 1 DOSE   ? LUMIGAN 0.01 % ophthalmic solution     Sig: Place 1 drop into both eyes at bedtime.     Dispense:  7.5 mL     Refill:  1   ? losartan 50 mg tablet     Sig: Take 1 tablet (50 mg total) by mouth daily.     Dispense:  90 tablet     Refill:  3       Return in about 1 month (around 11/29/2021) for HTN Follow Up with Dr. Betti Cruz, okay to use same day/per md.    Future Appointments   Date Time Provider Department Center   12/11/2021  1:30 PM PORTER [facility] US01 [place] PRT [facility] [place]   12/11/2021  2:15 PM PORTER  [facility] US01 [place] PRT [facility] [place]   12/18/2021  3:45 PM Beaulah Dinning., MD PRIM CARE PR [facility] [place]   12/18/2021  4:00 PM CIC CT01 CIC CT [place]/   03/14/2022 12:00 PM PORTER [facility] ECHO 01 - CARDIOLOGY CI PRT [facility] [place]   03/14/2022  1:00 PM Randel Pigg, MD CARD [facility]       An after visit summary with all of these plans was given to the patient.    The above recommendation were discussed with the patient.  The patient has all questions answered satisfactorily and is in agreement with this recommended plan of care.    Signed,  Ross Ludwig, MD  Assistant Clinical Professor  Department of Medicine  [facility] [place], [place]  6:34 PM 11/03/2021

## 2021-10-30 ENCOUNTER — Institutional Professional Consult (permissible substitution): Payer: PRIVATE HEALTH INSURANCE | Attending: Cardiovascular Disease

## 2021-10-30 DIAGNOSIS — I2510 Atherosclerotic heart disease of native coronary artery without angina pectoris: Secondary | ICD-10-CM

## 2021-10-30 DIAGNOSIS — Z01810 Encounter for preprocedural cardiovascular examination: Secondary | ICD-10-CM

## 2021-10-30 MED ORDER — ATORVASTATIN CALCIUM 10 MG PO TABS
10 mg | ORAL_TABLET | Freq: Every evening | ORAL | 3 refills | Status: AC
Start: 2021-10-30 — End: ?

## 2021-10-30 MED ORDER — ASPIRIN 81 MG PO TBEC
81 mg | ORAL_TABLET | Freq: Every day | ORAL | 3 refills | Status: AC
Start: 2021-10-30 — End: ?

## 2021-10-30 MED ORDER — METOPROLOL TARTRATE 25 MG PO TABS
25 mg | ORAL_TABLET | ORAL | 0 refills | Status: AC
Start: 2021-10-30 — End: ?

## 2021-10-30 NOTE — Progress Notes
PRIMARY CARE PHYSICIAN: Beaulah Dinning., MD    OUTPATIENT CARDIOLOGY CONSULT NOTE    Subjective: Nina Cox is a pleasant 71 year old female with history of coronary artery disease, uncontrolled hypertension and dyslipidemia is referred to Cardiology Clinic for preop cardiovascular exam.  She is planning to have a breast implant surgery by Dr. Elsworth Soho in 2 days from now. She is very active physically and sometimes feels chest discomfort in the middle of her chest.  These episodes are not necessarily exertional and lasts for a few minutes.  Her blood pressure is not well controlled and her family physician increase her losartan to 50 mg but she has not picked up the medication from the pharmacy yet.  She was prescribed hydrochlorothiazide in the past but never took the medications since she is not a fan of too many medications.  She denies having any shortness of breath, palpitations, orthopnea, presyncope or syncope.     Review of Systems:  Gen: no fevers, chills, or night sweats.  HEENT: no acute visual or hearing changes; no new oral lesions or excess secretions.  CV: no chest pain, orthopnea, PND, palpitations, or syncope.  Resp: no SOB, DOE, cough, or hemoptysis.  GI: no diarrhea, constipation, hematochezia, or melena.  GU: no dysuria, urgency, or hematuria.  Musc: no acute joint swelling or pain.  Skin: no acute rashes.  Neuro: no acute numbness, weakness, or seizures.  Psych: no anxiety or depression.  Endocrine: no heat or cold intolerance.  Heme/Lymph: no spontaneous bruising, bleeding, or lymph node swelling.  Allergic/Immunologic: no acute allergic reactions or hives.    Past Medical History:   Diagnosis Date   ? Glaucoma    ? Hypertension        Past Surgical History:   Procedure Laterality Date   ? BREAST SURGERY         Family History   Problem Relation Age of Onset   ? Coronary artery disease Father        Social History     Tobacco Use   ? Smoking status: Never   ? Smokeless tobacco: Never Vaping Use   ? Vaping Use: Never used   Substance Use Topics   ? Alcohol use: Yes   ? Drug use: No       Allergies   Allergen Reactions   ? Amlodipine      Other reaction(s): Other  Edema        Medications that the patient states to be currently taking   Medication Sig   ? ALBUTEROL 90 mcg/act inhaler INHALE 1 PUFF BY MOUTH EVERY 4 HOURS AS NEEDED FOR SHORTNESS OF BREATH OR WHEEZING   ? lidocaine 5% ointment APPLY TOPICALLY TO THE AFFECTED AREA 1 TIME FOR 1 DOSE   ? losartan 50 mg tablet Take 1 tablet (50 mg total) by mouth daily.   ? LUMIGAN 0.01 % ophthalmic solution Place 1 drop into both eyes at bedtime.         EXAM:  BP 175/79  ~ Pulse 61  ~ Ht 4' 11'' (1.499 m)  ~ Wt 125 lb (56.7 kg)  ~ LMP  (LMP Unknown)  ~ SpO2 96%  ~ BMI 25.25 kg/m?       01/16/2021     2:56 PM 01/16/2021     2:59 PM 04/29/2021    11:43 AM 10/22/2021     3:21 PM 10/22/2021     3:42 PM 10/29/2021     4:30 PM 10/30/2021  2:36 PM   Vitals - 1 value per visit   SYSTOLIC 187 171 308 173 173 163 175   DIASTOLIC 78 81 64 80 91 70 79   Heart Rate 81 69 64 71  60 61   Temp    36.2 ?C (97.1 ?F)  36.6 ?C (97.9 ?F)    Resp 16  16 16  16     Weight (lb)    126  124 125   Height    5' 1'' (1.549 m)  4' 11.84'' (1.52 m) 4' 11'' (1.499 m)   BMI    23.81 kg/m2  24.34 kg/m2 25.25 kg/m2   SpO2 98 %  98 % 96 %  96 % 96 %   Visit Report Report Report Report   Report        GEN: awake and alert x3, no acute distress. Able to converse.  HEENT: PERRL, EOMI,moist conjunctivae, oral cavity clear; ears and nose atraumatic.   NECK: No significant JVD; +2 carotids bilaterally with no significant bruits.   HEART: RRR with normal S1 and S2, no murmurs/rubs/gallops  LUNGS: CTAB bilaterally with no rales or wheezes. Normal respiratory effort.  ABD: soft, NT, ND, +BS.   EXT: non-tender with no edema   MUSC: no joint swelling or effusions with normal range of movement of upper and lower extremities.  SKIN: no acute rashes.    ASCVD Risk Estimation  22.8% is the estimated 10-year risk of atherosclerotic cardiovascular disease (ASCVD) as of 2:45 PM on 10/30/2021  Values used to calculate ASCVD score:  Age: 71 y.o.    Gender: Female Race: Not African American.  62 mg/dL. (measured on 10/23/2021)  151 mg/dL. (measured on 10/23/2021)  175 mm Hg. (measured on 10/30/2021)  Yes  currently not a smoker  No  Click here for the 2013 ACC/AHA Cholesterol Treatment Guideline Summary (PDF).  Click here for the 2013 ACC/AHA Cardiovascular Risk Estimator tool Office manager).    Lab Results   Component Value Date    WBC 6.93 10/23/2021    HGB 17.4 (H) 10/23/2021    HCT 49.7 (H) 10/23/2021    MCV 94.1 10/23/2021    PLT 181 10/23/2021     Lab Results   Component Value Date    CREAT 0.44 (L) 10/23/2021    BUN 11 10/23/2021    NA 141 10/23/2021    K 4.1 10/23/2021    CL 103 10/23/2021    CO2 24 10/23/2021     Lab Results   Component Value Date    HGBA1C 5.4 10/23/2021     Lab Results   Component Value Date    ALT 36 10/23/2021    AST 49 10/23/2021    ALKPHOS 41 10/23/2021    BILITOT 0.5 10/23/2021     Lab Results   Component Value Date    TSH 1.2 10/23/2021     Lab Results   Component Value Date    CALCIUM 10.1 10/23/2021     Lab Results   Component Value Date    CHOL 151 10/23/2021    CHOLHDL 62 10/23/2021    CHOLDLCAL 79 10/23/2021    TRIGLY 52 10/23/2021     Chest CT 08/24/2020: personally reviewed and interpreted, calcified plaques seen in the aorta and left main/ostial LAD      ECG 10/22/2021:  personally reviewed and interpreted. NSR, VR 69 bpm, PR interval 176 ms, QRS duration 80 ms, QTc 441 ms.      ASSESSMENT: Nina  Cox is a pleasant 71 year old female with history of coronary artery disease, uncontrolled hypertension and dyslipidemia is referred to Cardiology Clinic for preop cardiovascular exam.      PLAN:  #Preop Cardiovascular exam:  She is at intermediate cardiovascular risk for an intermediate risk procedure for breast implant surgery in 2 days from now by Dr. Elsworth Soho (phone number: [phone]).  Given her chest discomfort and coronary artery disease seen in chest CT decided to proceed with a coronary CT angiogram to rule out ischemia.  I also told the patient to reschedule her operation to her blood pressure is better controlled..  Will prescribe metoprolol 25 mg to be taken 2-3 hours prior to her coronary CT angiogram    #Hypertension:  Not well controlled  - agree with increasing losartan to 50 mg daily  - home blood pressure monitoring instructed  - will get an echocardiogram    #Coronary artery disease:  Seen on chest CT  - will start aspirin 81 mg after her procedure  - will put her on atorvastatin 10 mg q.h.s.  - CT angiogram as above    #Dyslipidemia: LDL 79 goal < 70  - will put her on atorvastatin 10 mg q.h.s.  - will repeat her fasting lipid panel profile in 3 months    #Follow up: 3 months from now    Thank you for the opportunity to continue to participate in your patient's care.    Antony Haste, MD  [facility] [employer]  Dept. of Medicine, Div. of Cardiology  [facility] [facility]  [address],   [place], [place] [address]

## 2021-11-04 DIAGNOSIS — D751 Secondary polycythemia: Secondary | ICD-10-CM

## 2021-11-05 ENCOUNTER — Telehealth: Payer: PRIVATE HEALTH INSURANCE

## 2021-11-05 NOTE — Telephone Encounter
Patient's pre-op clearance has been faxed to office on 11/05/2021 at 11:48 AM and confirmation has been received.      Nina Cox 11/05/2021 11:48 AM

## 2021-11-05 NOTE — Telephone Encounter
Fax is not going through. Called office front desk stated their fax line is down requesting to me emailed. Was notified will speak with my office manager due to HIPPA we do not email.    Jacqualyn Posey de Milbert Coulter 11/05/2021 2:18 PM

## 2021-11-05 NOTE — Telephone Encounter
Since surgeon's office fax number is non-operational, I sent preop packet via secure email to Dr. Mikeal Hawthorne    [email or web address]    Mikeal Hawthorne  Cosmetic/Plastic and Reconstructive Surgery  [address] [place]  [place], [place] [address]  Phone # [phone]  Fax # [phone]

## 2021-11-05 NOTE — Telephone Encounter
Noted thank you    Nina Cox 11/05/2021 3:24 PM

## 2021-11-21 MED ORDER — HYDROCHLOROTHIAZIDE 12.5 MG PO TABS
ORAL_TABLET | 0 refills
Start: 2021-11-21 — End: ?

## 2021-11-26 ENCOUNTER — Telehealth: Payer: PRIVATE HEALTH INSURANCE

## 2021-11-26 NOTE — Telephone Encounter
Schedule pt an appt for 11am with Dr. Oneita Jolly

## 2021-11-26 NOTE — Telephone Encounter
Message to Practice/Provider      Message: Patient called in wanting to see about being prescribed something for diarrhea.   Patient stated she has had diarrhea for a week, I offered an appt but patient declined. Please assist, thank you    Symptom: Diarrhea  Outcome: Schedule Appointment  Reason: Caller denied all higher acuity questions    Return call is not being requested by the patient or caller.    Patient or caller has been notified of the turnaround time of 1-2 business day(s).

## 2021-11-27 ENCOUNTER — Telehealth: Payer: PRIVATE HEALTH INSURANCE

## 2021-11-27 ENCOUNTER — Ambulatory Visit: Payer: PRIVATE HEALTH INSURANCE

## 2021-11-27 NOTE — Telephone Encounter
New Rx Request      Last seen by MD: 10/29/2021    Reason for the request: Patient arrived late to appointment today 11/27/21 advised she was told incorrect time of appointment and office advised soonest available not until December.    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 no    Was an appointment offered? Yes patient was scheduled and arrived late to appointment.    Patient or caller has been notified of the turnaround time of 1-2 business day(s).

## 2021-12-04 NOTE — Telephone Encounter
Outgoing call to patient left VM asking to return my call to discuss her experience.     I also scheduled patient for sooner appointment asked patient to call back if would like to discuss and if she would liek to keep appointment

## 2021-12-04 NOTE — Telephone Encounter
Future Appointments      Dec 10, 2021  1:15 PM  RETURN with Beaulah Dinning, MD  [facility]) [address]  [place] [address]  [phone]     Dec 11, 2021  1:30 PM  (Arrive by 1:15 PM)  [place] HEAD-NECK with [facility] US01  [facility]da Sers - Primary & Specialty Care - Radiology (XR) [facility]) [address]  [place] [address]     Dec 11, 2021  2:15 PM  (Arrive by 2:00 PM)  US KIDNEY BILATERAL with [facility] US01  [facility] - Primary [facility] Specialty Care - Radiology (XR) [facility]) [address]  [place] [address]     Dec 18, 2021  3:45 PM  RETURN with Beaulah Dinning, MD  [facility]) [address]  [place] [address]  [phone]     Dec 18, 2021  4:00 PM  (Arrive by 3:45 PM)  CT CORONARY Elisabeth Pigeon CONTRAST with [facility] CT01  [facility]) [phone] [address]  [address]  [place] [address]  [phone]     Mar 14, 2022 12:00 PM  Transthoracic echo complete adult with [facility] ECHO 01 - CARDIOLOGY, [facility] TREADMILL 01  CARDIAC IMG  CARDIAC IMG [facility] [place]) [address]  [address]  [place] [address]  [phone]     Mar 14, 2022  1:00 PM  RETURN with Randel Pigg, MD   [facility] Multispecialties [facility]) [address]  [place] [address]  [phone]

## 2021-12-10 ENCOUNTER — Ambulatory Visit: Payer: PRIVATE HEALTH INSURANCE

## 2021-12-10 DIAGNOSIS — R195 Other fecal abnormalities: Secondary | ICD-10-CM

## 2021-12-10 DIAGNOSIS — R197 Diarrhea, unspecified: Secondary | ICD-10-CM

## 2021-12-10 NOTE — Progress Notes
[facility]  Outpatient Follow Up/Progress Note  PMD: Beaulah Dinning., MD  12/10/2021      Chief Complaint   Patient presents with    Hypertension           SUBJECTIVE     Nina Cox is a 71 y.o. female with below listed PMH who presents for     # HTN  - currently taking losartan 50mg  once daily, recently increased from annual physical  - ''I do not check my blood pressure at home''    # diarrhea  - per pt ongoing since approx 11/22  - drinking coca cola during appointment today  - 5-6 episodes daily  - per pt had this occur in the past, then resolved for a few years  - has not been able to eat much recently  - denies any change in her diet prior to diarrhea onset  - per pt has lost several lbs (weighed 115lbs at the gym today with her trainer), declined for [place] to check her weight in office today    # positive FIT test follow up  - does not want to pursue colonoscopy at this time       No other acute concerns      Review of Systems - A complete 14-system review of system was performed with pertinent positives and negatives included above and in the HPI and otherwise reviewed and found to be negative and/or non-contributory.      Past Medical History:   Diagnosis Date    Glaucoma     Hypertension      Past Surgical History:   Procedure Laterality Date    BREAST SURGERY         Medications: reviewed medication list in the chart    Medications that the patient states to be currently taking   Medication Sig    ALBUTEROL 90 mcg/act inhaler INHALE 1 PUFF BY MOUTH EVERY 4 HOURS AS NEEDED FOR SHORTNESS OF BREATH OR WHEEZING    losartan 50 mg tablet Take 1 tablet (50 mg total) by mouth daily.    LUMIGAN 0.01 % ophthalmic solution Place 1 drop into both eyes at bedtime.       Allergies: Amlodipine        OBJECTIVE     BP 154/74  ~ Pulse 68  ~ Temp 36.6 ?C (97.9 ?F) (Oral)  ~ Resp 16  ~ LMP  (LMP Unknown)  ~ SpO2 96%     General appearance - alert, and in no distress, flat affect  Eyes - extraocular eye movements intact      Labs:  Results for orders placed or performed in visit on 10/23/21   Fecal Immunochemical Test    Specimen: Stool/Rectal/Intestinal   Result Value Ref Range    Fecal Immunochemical Test Positive (A) Negative   Comprehensive Metabolic Panel   Result Value Ref Range    Sodium 141 135 - 146 mmol/L    Potassium 4.1 3.6 - 5.3 mmol/L    Chloride 103 96 - 106 mmol/L    Total CO2 24 20 - 30 mmol/L    Anion Gap 14 8 - 19 mmol/L    Glucose 105 (H) 65 - 99 mg/dL    Creatinine 4.54 (L) 0.60 - 1.30 mg/dL    Estimated GFR >09 See GFR Additional Information mL/min/1.98m2    GFR Additional Information See Comment      Comment: GFR >89.........Marland KitchenNormal   GFR 60 - 89...Marland KitchenMarland KitchenNormal to  mildly decreased   GFR 45 - 59...Marland KitchenMarland KitchenMildly to moderately decreased   GFR 30 - 44...Marland KitchenMarland KitchenModerately to severely decreased   GFR 15 - 29...Marland KitchenMarland KitchenSeverely decreased   GFR <15.........Marland KitchenKidney failure   The 2021 CKD-EPI creatinine equation was used to calculate the estimated GFR and assumes stable creatinine concentrations.   Results are in mL/min/1.73 square meters.  The patient's eGFR MAY need to be adjusted for drug dosing.   For drug dosing, utilize eGFR or eCrCl.   If using the eGFR in very large or very small patients, then multiply the reported eGFR by the estimated BSA and divide by 1.73 m2, in order to obtain eGFR in units of mL/min.    Urea Nitrogen 11 7 - 22 mg/dL    Calcium 29.5 8.6 - 62.1 mg/dL    Total Protein 8.0 6.1 - 8.2 g/dL    Albumin 4.7 3.9 - 5.0 g/dL    Bilirubin,Total 0.5 0.1 - 1.2 mg/dL    Alkaline Phosphatase 41 37 - 113 U/L    Aspartate Aminotransferase 49 13 - 62 U/L    Alanine Aminotransferase 36 8 - 70 U/L   Hgb A1c   Result Value Ref Range    Hgb A1c 5.4 <5.7 %     Comment: For patients with diabetes, an A1c less than (<) or equal (=) to 7.0% is recommended for most patients, however the goal may be higher or lower depending on age and/or other medical problems.   For a diagnosis of diabetes, A1c greater than (>) or equal(=) to 6.5% indicates diabetes; values between 5.7% and 6.4% may indicate an increased risk of developing diabetes.   Lipid Panel   Result Value Ref Range    Cholesterol 151 See Comment mg/dL     Comment: The significance of total cholesterol depends on the values of LDL, HDL, triglycerides and the clinical context. A patient-provider discussion may be considered.        Cholesterol,LDL,Calc 79 <100 mg/dL     Comment: If LDL value falls outside of the designated range AND if  included in any of the following categories, a  patient-provider discussion is recommended.     Statin therapy is recommended for individuals:  1. with clinical atherosclerotic cardiovascular disease     irrespective of LDL levels;  2. with LDL > or = 190 mg/dL;  3. with diabetes, aged 40-75 years, with LDL between 70 and     189 mg/dL;  4. without any of the above but who have LDL between 70 and     189 mg/dL and an estimated 30-QMVH risk of     atherosclerotic cardiovascular disease > or = 7.5%     (consider statin therapy if estimated 10-year risk > or =     5.0%) (ACC/AHA 2013 Guidelines).    Cholesterol, HDL 62 >50 mg/dL     Comment: If HDL cholesterol level falls outside of the designated  range, a patient-provider discussion is recommended    Triglycerides 52 <150 mg/dL     Comment: If Triglyceride level falls outside of the designated range,  a patient-provider discussion is recommended.      Non-HDL,Chol,Calc 89 <130 mg/dL     Comment: If Non-HDL cholesterol level falls outside of the designated  range, a patient-provider discussion is recommended.   TSH with reflex FT4, FT3   Result Value Ref Range    TSH 1.2 0.3 - 4.7 mcIU/mL     Comment: TSH is normal, no further Thyroid tests were performed.  If applicable TSH pregnancy reference intervals:   First trimester (10-[redacted] weeks gestation): 0.03- 4.0 mcIU/mL  Second trimester (14-[redacted] weeks gestation): 0.19- 4.0 mcIU/mL     APTT   Result Value Ref Range    APTT 32.3 24.4 - 36.2 seconds     Comment: Refer to health system policy HS 1414 or contact Pharmacy for aPTT therapeutic ranges.   Prothrombin Time Panel   Result Value Ref Range    Prothrombin Time 13.6 11.5 - 14.4 seconds    INR 1.0 .     Comment: Therapeutic Range: INR 2-3  Mechanical Valves: INR 2.5-3.5     CBC   Result Value Ref Range    White Blood Cell Count 6.93 4.16 - 9.95 x10E3/uL    Red Blood Cell Count 5.28 (H) 3.96 - 5.09 x10E6/uL    Hemoglobin 17.4 (H) 11.6 - 15.2 g/dL    Hematocrit 16.1 (H) 34.9 - 45.2 %    Mean Corpuscular Volume 94.1 79.3 - 98.6 fL    Mean Corpuscular Hemoglobin 33.0 26.4 - 33.4 pg    MCH Concentration 35.0 31.5 - 35.5 g/dL    Red Cell Distribution Width-SD 45.2 36.9 - 48.3 fL    Red Cell Distribution Width-CV 13.2 11.1 - 15.5 %    Platelet Count, Auto 181 143 - 398 x10E3/uL    Mean Platelet Volume 10.5 9.3 - 13.0 fL    Nucleated RBC%, automated 0.0 No Ref. Range %     Comment: Percent Reference Range Not Reported per accrediting agency    Absolute Nucleated RBC Count 0.00 0.00 - 0.00 x10E3/uL    Neutrophil Abs (Prelim) 3.62 See Absolute Neut Ct. x10E3/uL     Comment: This is a preliminary result.  If automated differential see Absolute Neut  Count or if manual differential see Absolute Neut Ct, Manual for final result.   Differential, Automated   Result Value Ref Range    Neutrophil Percent, Auto 52.3 No Ref. Range %     Comment: Percent reference range not reported per accrediting agency.      Lymphocyte Percent, Auto 32.0 No Ref. Range %     Comment: Percent reference range not reported per accrediting agency.      Monocyte Percent, Auto 7.5 No Ref. Range %     Comment: Percent reference range not reported per accrediting agency.      Eosinophil Percent, Auto 7.4 No Ref. Range %     Comment: Percent reference range not reported per accrediting agency.      Basophil Percent, Auto 0.7 No Ref. Range %     Comment: Percent reference range not reported per accrediting agency.      Immature Granulocytes% 0.1 No Reference Range %     Comment: Percent reference range not reported per accrediting agency.      Absolute Neut Count 3.62 1.80 - 6.90 x10E3/uL    Absolute Lymphocyte Count 2.22 1.30 - 3.40 x10E3/uL    Absolute Mono Count 0.52 0.20 - 0.80 x10E3/uL    Absolute Eos Count 0.51 (H) 0.00 - 0.50 x10E3/uL    Absolute Baso Count 0.05 0.00 - 0.10 x10E3/uL    Absolute Immature Gran Count 0.01 0.00 - 0.04 x10E3/uL   UA,Dipstick    Specimen: Clean Catch, Midstream; Urine   Result Value Ref Range    Urine Color Yellow      Specific Gravity 1.014 1.005 - 1.030    pH,Urine 6.0 5.0 - 8.0    Blood Negative Negative  Bilirubin Negative Negative    Ketones Negative Negative    Glucose Negative Negative    Protein Negative Negative    Leukocyte Esterase Negative Negative    Nitrite Negative Negative   UA,Microscopic    Specimen: Clean Catch, Midstream; Urine   Result Value Ref Range    RBC per uL 3 0 - 11 cells/uL    WBC per uL 8 0 - 22 cells/uL    RBC per HPF 1 0 - 2 cells/HPF    WBC per HPF 2 0 - 4 cells/HPF    Squamous Epi Cells 28 (H) 0 - 17 cells/uL   HIV-1/2 Ag/Ab 4th Generation with Reflex Confirmation   Result Value Ref Range    HIV-1/2 Ag/Ab Screen 4th Generation Nonreactive Nonreactive     Comment: This test was performed using the Elecsys HIV Duo assay on a Roche [id number] analyzer.         Imaging:  No results found.        ASSESSMENT AND PLAN:      Nina Cox is a 71 y.o. female who presents for   Chief Complaint   Patient presents with    Hypertension     Overall difficult to proceed with visit as patient was very focused on how she was not seen on 11/22 due to showing up late for her appointment at that time, states she has had several issues with [employer] and not being given clear directions on when her appointment times are.  Stated that every person working in our office is incompetent and that her phone calls are never returned.    1. Essential hypertension  - discussed not at goal, goal blood pressure reviewed  - pt did not want to discuss further management today  - as a result can continue losartan 50mg  once daily    2. Positive FIT (fecal immunochemical test)  - pt declined to complete colonoscopy  - discussed risks of missing pre-cancerous or possible cancerous lesion, pt stated she is aware of the risks and opted to decline further evaluation    3. Diarrhea, unspecified type  - advised to completed stool testing to rule out possible infectious cause and continue further eval with GI  - pt declined to check labs today, ordered given continuing diarrhea and decreased PO intake from history, stated she does not have time  - Bact Enteric Pathogen Panel PCR, Stool; Future  - Ova and Parasites,Conc-Smear; Future  - C.difficile PCR with Reflex to Toxin Antigen; Future  - Basic Metabolic Panel; Future  - Referral to Gastroenterology     35 minutes were spent personally by me today on this encounter which include today's pre-visit review of the chart, obtaining appropriate history, performing an evaluation, documentation and discussion of management with details supported within the note for today's visit. The time documented was exclusive of any time spent on the separately billed procedure.          RHM (to be addressed at annual wellness visit)   Health Maintenance   Topic Date Due    Shingles (Shingrix) Vaccine (1 of 2) Never done    Advance Directive  Never done    Pneumococcal Vaccine (1 - PCV) Never done    COVID-19 Vaccine(Tracks primary and booster doses, not sup/immunocomp) (3 - Pfizer series) 07/23/2019    Influenza Vaccine (1) Never done    Breast Ca Screening: MAMMOGRAM  12/06/2021    Colorectal Cancer Screening  10/29/2022    Annual  Preventive Wellness Visit  10/30/2022    Tdap/Td Vaccine (2 - Td or Tdap) 06/11/2027    Hepatitis B Screening  Completed    Osteoporosis Early Detection DEXA Scan  Completed    Hepatitis C Screening  Completed    Statin prescribed for ASCVD Prevention or Treatment Completed       Vaccines:  Immunization History   Administered Date(s) Administered    COVID-19, mRNA, (Pfizer - Purple Cap) 30 mcg/0.3 mL 05/07/2019, 05/28/2019    Hepatitis B, unspecified formulation 07/05/2004    Td 04/14/2007    Tdap 06/10/2017         The above plan of care, diagnosis, orders, and follow-up were discussed with the patient.  Questions related to this recommended plan of care were answered.    There are no Patient Instructions on file for this visit.    No follow-ups on file. or sooner PRN    Future Appointments   Date Time Provider Department Center   12/11/2021  1:30 PM [facility] US01 [place] PRT [facility] [place]   12/11/2021  2:15 PM [facility]TER [facility] US01 [place] PRT [facility] [place]   12/18/2021  3:45 PM Beaulah Dinning., MD PRIM CARE PR [place]   12/18/2021  4:00 PM [facility] CT01 CIC CT [place]/   02/04/2022  1:30 PM Harvel Quale., MD GSTRO [facility] [place]   03/14/2022 12:00 PM PORTER [facility] ECHO 01 - CARDIOLOGY CI PRT [facility] [place]   03/14/2022  1:00 PM Randel Pigg, MD CARD [address]         Signed,  Ross Ludwig, MD  Assistant Clinical Professor  Department of Medicine  [place] [facility] [place], [place]  2:20 [place] 12/10/2021

## 2021-12-18 ENCOUNTER — Ambulatory Visit: Payer: PRIVATE HEALTH INSURANCE

## 2021-12-18 DIAGNOSIS — Z01810 Encounter for preprocedural cardiovascular examination: Secondary | ICD-10-CM

## 2021-12-19 ENCOUNTER — Inpatient Hospital Stay: Payer: PRIVATE HEALTH INSURANCE | Attending: Cardiovascular Disease

## 2021-12-19 ENCOUNTER — Ambulatory Visit: Payer: PRIVATE HEALTH INSURANCE

## 2021-12-19 DIAGNOSIS — Z01810 Encounter for preprocedural cardiovascular examination: Secondary | ICD-10-CM

## 2022-01-03 MED ORDER — METOPROLOL TARTRATE 25 MG PO TABS
25 mg | ORAL_TABLET | ORAL | 0 refills
Start: 2022-01-03 — End: ?

## 2022-01-08 MED ORDER — METOPROLOL TARTRATE 25 MG PO TABS
25 mg | ORAL_TABLET | ORAL | 0 refills
Start: 2022-01-08 — End: ?

## 2022-01-14 MED ORDER — METOPROLOL TARTRATE 25 MG PO TABS
ORAL_TABLET | 0 refills | Status: AC
Start: 2022-01-14 — End: ?

## 2022-01-27 ENCOUNTER — Telehealth: Payer: PRIVATE HEALTH INSURANCE

## 2022-01-27 NOTE — Telephone Encounter
Spoke to patient on 01/27/2022. Patient had a appointment scheduled for 01/29/2022 with   Dr. Minette Headland however appointment was scheduled for 15 mins but required a 30 min visit. I called patient to reschedule appointment patient got upset call me a ''Bitch'' and hung up. [facility] please assist with rescheduling appointment with any available MD if patient calls back..    Thank you

## 2022-01-29 ENCOUNTER — Ambulatory Visit: Payer: PRIVATE HEALTH INSURANCE

## 2022-02-02 NOTE — Consults
[facility], [facility]  [address], [address]  [place], [place] [address]  Phone: [phone]  Fax: [phone]     Outpatient Gastroenterology Consult Note                                     PATIENT: Nina Cox  MRN: [id number]  DOB: 02/15/1950  DATE OF SERVICE: 02/02/2022    REFERRING PRACTITIONER: Beaulah Dinning., MD  PRIMARY CARE PROVIDER: Beaulah Dinning., MD    REASON FOR REFERRAL:  Diarrhea    Patient Active Problem List   Diagnosis    Essential hypertension    Hyperlipidemia    Prediabetes    Nevus of choroid    Insufficiency, tear film    Chronic nonalcoholic liver disease    Primary open angle glaucoma (POAG) of both eyes    Hardening of the aorta (main artery of the heart) (HCC/RAF)    Abnormal tomography of chest    Atherosclerosis of native coronary artery of native heart       Chief Complaint: No chief complaint on file.  Diarrhea    HPI:     Nina Cox is a 72 y.o. female with PMH hypertension, hyperlipidemia, prediabetes who presents for initial consultation for Diarrhea    No GERD, dysphagia, odynophagia, unintended weight loss, abdpain, brbpr, melena, f/c/n/v, constipation, diarrhea, NSAIDs.    Past Medical History:  Past Medical History:   Diagnosis Date    Glaucoma     Hypertension         Past Surgical History:  Past Surgical History:   Procedure Laterality Date    BREAST SURGERY         Allergies:  Allergies   Allergen Reactions    Amlodipine      Other reaction(s): Other  Edema        Current Meds Reviewed:  Current Outpatient Medications   Medication Sig    ALBUTEROL 90 mcg/act inhaler INHALE 1 PUFF BY MOUTH EVERY 4 HOURS AS NEEDED FOR SHORTNESS OF BREATH OR WHEEZING    aspirin 81 mg EC tablet Take 1 tablet (81 mg total) by mouth daily. (Patient not taking: Reported on 12/10/2021.)    atorvastatin 10 mg tablet Take 1 tablet (10 mg total) by mouth at bedtime. (Patient not taking: Reported on 12/10/2021.)    lidocaine 5% ointment APPLY TOPICALLY TO THE AFFECTED AREA 1 TIME FOR 1 DOSE (Patient not taking: Reported on 12/10/2021.)    losartan 50 mg tablet Take 1 tablet (50 mg total) by mouth daily.    LUMIGAN 0.01 % ophthalmic solution Place 1 drop into both eyes at bedtime.    METOPROLOL TARTRATE 25 mg tablet TAKE 1 TABLET BY MOUTH 2-3 HOURS BEFORE YOUR CORONARY CT ANGIOGRAM    prednisoLONE acetate 1% ophthalmic suspension SHAKE LIQUID AND INSTILL 1 DROP IN BOTH EYES FOUR TIMES DAILY FOR 2 WEEKS (Patient not taking: Reported on 04/29/2021.)     No current facility-administered medications for this visit.        Social History:  Social History     Socioeconomic History    Marital status: Divorced   Tobacco Use    Smoking status: Never    Smokeless tobacco: Never   Vaping Use    Vaping Use: Never used   Substance and Sexual Activity    Alcohol use: Yes    Drug use: No    Sexual activity:  Not Currently   Other Topics Concern    1. Need Help Feeding Yourself? No    2. Need Help Getting Dressed? No    3. Need Help Using the Telephone? No    4. 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 Need Help Shopping for Groceries? No    6. Need Help Getting Places Beyond Walking Distance? (Bus, Taxi) No    7. Need Help Getting from Bed to Chair? No    8. Need Help Bathing or Showering? No    9. Need Help Taking your Medications? No    10.  Need Help Doing Moderately Strenuous Housework? (ex. Laundry) No    11. Need Help Driving? No    12. Need Help Getting to the Toilet? No    13. Need Help Walking Across the Road? (Includes [place], Walker) No    14. Need Help Preparing Meals? No    15. Need Help Shopping for Personal Items? (Toiletries, Medicines) No    16. Need Help Climbing a Flight of Stairs? No    17. Do you live with someone who assists you at home? No    18. Do you get help from family members or friends in your home? No    19. Do you employ someone to provide health related care or help you in your home? No    20. Do you provide care for a family member? No    21. Does your home have rugs in the hallway? No    22. Does your home have poor lighting? No    23. Does your home lack grab bars in the bathroom? No    24. Does your home lack handrails on the stairs? Yes    25. Have you noticed any hearing difficulties? No    26. Do you currently participate in any regular activity to improve or maintain your physical fitness? Yes    27. Do you always wear a seatbelt when you ride in a car? Yes    28. If you drink alcohol, do you drink more than 7 drinks per week or more than 3 drinks on any given day? No    29. Has anyone ever been concerned about your drinking? No    Do you exercise at least a day, 3 or more days a week? Yes    Types of Exercise? (List in Comments) No    Do you follow a special diet? No    Vegan? No    Vegetarian? No    Pescatarian? No    Lactose Free? No    Gluten Free? No    Omnivore? No      No alcohol  No tobacco    Family History:  Family History   Problem Relation Age of Onset    Coronary artery disease Father           No GI cancers, Liver cancer/disease    Objective:     Physical Exam:    Vitals:LMP  (LMP Unknown)      Constitutional: Well developed, well nourished, alert, cooperative, and in no acute distress   Head: Normocephalic, without obvious abnormality, atraumatic   Eyes: Anicteric sclera. EOM's intact.    Nose: Nares normal. Septum midline. Mucosa normal.    Mouth: Moist mucus membranes, clear oropharynx   Neck: No adenopathy, supple, symmetric, no tenderness/mass/nodules   Lungs: Clear to auscultation bilaterally, no rhonchi, no wheezing   Heart: S1, S2 normal,  regular rate and rhythm, no murmurs, clicks, rubs, or gallops   Gastrointestinal: Abdomen is soft, non-tender, non-distended with normo-active bowel sounds. No rebound tenderness, guarding, or palpable hepatosplenomegaly.    Extremities: No cyanosis, clubbing, or edema   Skin: Skin color, texture, turgor normal. No rashes or lesions   Neurological: Alert, oriented x 4, normal affect     Constitutional: Well developed, well nourished, alert, cooperative, and in no acute distress   Head: Normocephalic, without obvious abnormality, atraumatic   Eyes: Anicteric sclera. EOM's intact.    Neck: Symmetric, Range of motion intact   Lungs: Equal rise, breathing comfortably on room air   Skin: Skin color, texture, turgor normal. No rashes or lesions   Neurological: Alert, oriented x 4, normal affect    Labs Reviewed:  Lab Results   Component Value Date    NA 141 10/23/2021    K 4.1 10/23/2021    CL 103 10/23/2021    CO2 24 10/23/2021    BUN 11 10/23/2021    CREAT 0.44 (L) 10/23/2021    CALCIUM 10.1 10/23/2021     Lab Results   Component Value Date    WBC 6.93 10/23/2021    HGB 17.4 (H) 10/23/2021    HCT 49.7 (H) 10/23/2021    MCV 94.1 10/23/2021    PLT 181 10/23/2021    INR 1.0 10/23/2021    APTT 32.3 10/23/2021     Lab Results   Component Value Date    AST 49 10/23/2021    ALT 36 10/23/2021    ALKPHOS 41 10/23/2021    BILITOT 0.5 10/23/2021    ALBUMIN 4.7 10/23/2021     No results found for: ''SRWEST'', ''CRP''  No results found for: ''IRON'', ''TIBC'', ''FERRITIN''    Imaging Reviewed:      GI Studies Reviewed:      MDM  Number and Complexity of Problems Addressed at the Encounter:   []   1 or more chronic illness with exacerbation, progression, or side effects of treatment  []   2 or more stable chronic illness  []   1 undiagnosed new problem with uncertain diagnosis  []   1 acute illness with systemic symptoms  []   1 acute complicated injury     Review of Data: I have  []  Reviewed/ordered ? 3 unique laboratory, radiology, and/or diagnostic tests noted    []  Reviewed prior external notes and incorporated into patient assessment as noted  []  I have independently interpreted test performed by other physician(s) as noted   []  Discussed management or test interpretation with external provider(s) as noted     Risk of Complication and or Morbidity or Mortality of Patient Management including Social Determinants of Health:   []   I deem the above diagnoses to have a risk of complication, morbidity or mortality of Moderate   []  The diagnosis or treatment of said conditions is significantly limited by social determinants of health as noted.     Assessment/Plan:   Kynadie Yaun is a 72 y.o. female     Colonoscopy was discussed in detail with the patient. Risks, benefits and alternatives were also discussed. Risks include, but are not limited to, bleeding, perforation, adverse medication/sedation reaction, and missing lesions including malignancy. We discussed the need for a driver due to sedation. All questions were answered and the patient agreed to proceed.    EGD was discussed in detail with the patient. Risks, benefits and alternatives were also discussed. Risks include, but are not limited to, bleeding, perforation,  adverse medication/sedation reaction, and missing lesions including malignancy. We discussed the need for a driver due to sedation. All qestions were answered and the patient agreed to proceed.    EGD and colonoscopy were discussed in detail with the patient. Risks, benefits and alternatives were also discussed. Risks include, but are not limited to, bleeding, perforation, adverse medication/sedation reaction, and missing lesions including malignancy. We discussed the need for a driver due to sedation. All questions were answered and the patient agreed to proceed.    MAC anesthesia is medically necessary for this patient for the following higher risk situation(s):   - History of or anticipated intolerance to standard sedatives ()  - Prolonged or therapeutic endoscopic procedure requiring deep sedation  - Increased risk for complications due to severe comorbidity ()  - History of/active drug or alcohol abuse  - Morbid obesity  - Age of 85 years or older    Return to clinic weeks after endoscopy    Thank you for allowing me the opportunity to participate in the care of your patient.  Please do not hesitate to call me with any questions.      Author: Vonda Antigua, MD 02/02/2022 2:33 PM    Portions of this note may have been created with voice recognition software. Occassional wrong-word or ''sound-alike'' substitutions may have occurred due to the inherent limitations of voice recognition software. Please read the chart carefully and recognize, using context, where these substitutions have occurred. The above plan/recommendation(s) were discussed with the patient. The patient had all questions answered satisfactorily and is in agreement with this recommended plan of care.

## 2022-02-03 ENCOUNTER — Inpatient Hospital Stay: Payer: PRIVATE HEALTH INSURANCE | Attending: Cardiovascular Disease

## 2022-02-04 ENCOUNTER — Ambulatory Visit: Payer: PRIVATE HEALTH INSURANCE

## 2022-02-07 ENCOUNTER — Ambulatory Visit: Payer: PRIVATE HEALTH INSURANCE

## 2022-02-07 DIAGNOSIS — M25471 Effusion, right ankle: Secondary | ICD-10-CM

## 2022-02-07 DIAGNOSIS — R195 Other fecal abnormalities: Secondary | ICD-10-CM

## 2022-02-07 DIAGNOSIS — M549 Dorsalgia, unspecified: Secondary | ICD-10-CM

## 2022-02-07 DIAGNOSIS — R0789 Other chest pain: Secondary | ICD-10-CM

## 2022-02-07 DIAGNOSIS — L03312 Cellulitis of back [any part except buttock]: Secondary | ICD-10-CM

## 2022-02-07 MED ORDER — SULFAMETHOXAZOLE-TRIMETHOPRIM DOUBLE STRENGTH 800-160 MG PO TABS
1 | ORAL_TABLET | Freq: Two times a day (BID) | ORAL | 0 refills | Status: AC
Start: 2022-02-07 — End: 2022-02-08

## 2022-02-07 MED ORDER — SULFAMETHOXAZOLE-TRIMETHOPRIM DOUBLE STRENGTH 800-160 MG PO TABS
1 | ORAL_TABLET | Freq: Two times a day (BID) | ORAL | 0 refills | Status: AC
Start: 2022-02-07 — End: ?

## 2022-02-07 NOTE — Progress Notes
[facility]  Outpatient Follow Up/Progress Note  PMD: Beaulah Dinning., MD  02/07/2022      Chief Complaint   Patient presents with    Back Pain    Eye Problem           SUBJECTIVE     Nina Cox is a 72 y.o. female with below listed PMH who presents for     # back pain  - ongoing  - hard to get out of bed  - about a year ago, progressively worse  - no known triggers  - middle back  - L side > R side  - sharp pain  - this morning bent down and had a hard time getting back up  - unable to sleep on back, able to sleep on her side  - no radiation of pain  - has been t0 the chiropractor and tried heating pad  - used heating pad the other day and ended up falling asleep with it, ended up with a burn at the area of heating pad    # R ankle swelling  - first occurred 10-11 years prior, due to medication she was taking, believes may have been amlodipine  - no longer swollen today    # discomfort in chest  - comes and goes  - can last 30 minutes  - this week had to rush one of her dogs to the vet ER, felt these symptoms during the episode  - works as divorce attorney  - has these episodes once or twice a day  - believes the episodes are related to stress         No other acute concerns      Review of Systems - A complete 14-system review of system was performed with pertinent positives and negatives included above and in the HPI and otherwise reviewed and found to be negative and/or non-contributory.      Past Medical History:   Diagnosis Date    Glaucoma     Hypertension      Past Surgical History:   Procedure Laterality Date    BREAST SURGERY         Medications: reviewed medication list in the chart    Medications that the patient states to be currently taking   Medication Sig    ALBUTEROL 90 mcg/act inhaler INHALE 1 PUFF BY MOUTH EVERY 4 HOURS AS NEEDED FOR SHORTNESS OF BREATH OR WHEEZING    losartan 50 mg tablet Take 1 tablet (50 mg total) by mouth daily.    LUMIGAN 0.01 % ophthalmic solution Place 1 drop into both eyes at bedtime.       Allergies: Amlodipine        OBJECTIVE     BP 133/66  ~ Pulse 77  ~ Temp 36.6 ?C (97.9 ?F) (Oral)  ~ Resp 16  ~ Ht 4' 11'' (1.499 m)  ~ Wt 121 lb (54.9 kg)  ~ LMP  (LMP Unknown)  ~ SpO2 93%  ~ BMI 24.44 kg/m?     General appearance - alert, and in no distress  Eyes - extraocular eye movements intact, conjunctivae not injected  Respiratory - clear to auscultation, no wheezes, rales or rhonchi, symmetric air entry  Cardiovascular - normal rate, regular rhythm, normal S1, S2, no murmurs, rubs, clicks or gallops.   Back exam - ttp at L mid back, no ttp along thoracic spine  Skin - shallow ulcerations noted at L mid back with  surrounding erythema, ttp near ulcerations  Extremities - peripheral pulses normal, no pedal edema, no clubbing or cyanosis, no appreciable swelling in R ankle      Labs:  Results for orders placed or performed in visit on 10/23/21   Fecal Immunochemical Test    Specimen: Stool/Rectal/Intestinal   Result Value Ref Range    Fecal Immunochemical Test Positive (A) Negative   Comprehensive Metabolic Panel   Result Value Ref Range    Sodium 141 135 - 146 mmol/L    Potassium 4.1 3.6 - 5.3 mmol/L    Chloride 103 96 - 106 mmol/L    Total CO2 24 20 - 30 mmol/L    Anion Gap 14 8 - 19 mmol/L    Glucose 105 (H) 65 - 99 mg/dL    Creatinine 6.21 (L) 0.60 - 1.30 mg/dL    Estimated GFR >30 See GFR Additional Information mL/min/1.33m2    GFR Additional Information See Comment      Comment: GFR >89.........Marland KitchenNormal   GFR 60 - 89...Marland KitchenMarland KitchenNormal to mildly decreased   GFR 45 - 59...Marland KitchenMarland KitchenMildly to moderately decreased   GFR 30 - 44...Marland KitchenMarland KitchenModerately to severely decreased   GFR 15 - 29...Marland KitchenMarland KitchenSeverely decreased   GFR <15.........Marland KitchenKidney failure   The 2021 CKD-EPI creatinine equation was used to calculate the estimated GFR and assumes stable creatinine concentrations.   Results are in mL/min/1.73 square meters.  The patient's eGFR MAY need to be adjusted for drug dosing.   For drug dosing, utilize eGFR or eCrCl.   If using the eGFR in very large or very small patients, then multiply the reported eGFR by the estimated BSA and divide by 1.73 m2, in order to obtain eGFR in units of mL/min.    Urea Nitrogen 11 7 - 22 mg/dL    Calcium 86.5 8.6 - 78.4 mg/dL    Total Protein 8.0 6.1 - 8.2 g/dL    Albumin 4.7 3.9 - 5.0 g/dL    Bilirubin,Total 0.5 0.1 - 1.2 mg/dL    Alkaline Phosphatase 41 37 - 113 U/L    Aspartate Aminotransferase 49 13 - 62 U/L    Alanine Aminotransferase 36 8 - 70 U/L   Hgb A1c   Result Value Ref Range    Hgb A1c 5.4 <5.7 %     Comment: For patients with diabetes, an A1c less than (<) or equal (=) to 7.0% is recommended for most patients, however the goal may be higher or lower depending on age and/or other medical problems.   For a diagnosis of diabetes, A1c greater than (>) or equal(=) to 6.5% indicates diabetes; values between 5.7% and 6.4% may indicate an increased risk of developing diabetes.   Lipid Panel   Result Value Ref Range    Cholesterol 151 See Comment mg/dL     Comment: The significance of total cholesterol depends on the values of LDL, HDL, triglycerides and the clinical context. A patient-provider discussion may be considered.        Cholesterol,LDL,Calc 79 <100 mg/dL     Comment: If LDL value falls outside of the designated range AND if  included in any of the following categories, a  patient-provider discussion is recommended.     Statin therapy is recommended for individuals:  1. with clinical atherosclerotic cardiovascular disease     irrespective of LDL levels;  2. with LDL > or = 190 mg/dL;  3. with diabetes, aged 40-75 years, with LDL between 70 and     189 mg/dL;  4. without  any of the above but who have LDL between 70 and     189 mg/dL and an estimated 16-XWRU risk of     atherosclerotic cardiovascular disease > or = 7.5%     (consider statin therapy if estimated 10-year risk > or =     5.0%) (ACC/AHA 2013 Guidelines).    Cholesterol, HDL 62 >50 mg/dL     Comment: If HDL cholesterol level falls outside of the designated  range, a patient-provider discussion is recommended    Triglycerides 52 <150 mg/dL     Comment: If Triglyceride level falls outside of the designated range,  a patient-provider discussion is recommended.      Non-HDL,Chol,Calc 89 <130 mg/dL     Comment: If Non-HDL cholesterol level falls outside of the designated  range, a patient-provider discussion is recommended.   TSH with reflex FT4, FT3   Result Value Ref Range    TSH 1.2 0.3 - 4.7 mcIU/mL     Comment: TSH is normal, no further Thyroid tests were performed.    If applicable TSH pregnancy reference intervals:   First trimester (10-[redacted] weeks gestation): 0.03- 4.0 mcIU/mL  Second trimester (14-[redacted] weeks gestation): 0.19- 4.0 mcIU/mL     APTT   Result Value Ref Range    APTT 32.3 24.4 - 36.2 seconds     Comment: Refer to health system policy HS 1414 or contact Pharmacy for aPTT therapeutic ranges.   Prothrombin Time Panel   Result Value Ref Range    Prothrombin Time 13.6 11.5 - 14.4 seconds    INR 1.0 .     Comment: Therapeutic Range: INR 2-3  Mechanical Valves: INR 2.5-3.5     CBC   Result Value Ref Range    White Blood Cell Count 6.93 4.16 - 9.95 x10E3/uL    Red Blood Cell Count 5.28 (H) 3.96 - 5.09 x10E6/uL    Hemoglobin 17.4 (H) 11.6 - 15.2 g/dL    Hematocrit 04.5 (H) 34.9 - 45.2 %    Mean Corpuscular Volume 94.1 79.3 - 98.6 fL    Mean Corpuscular Hemoglobin 33.0 26.4 - 33.4 pg    MCH Concentration 35.0 31.5 - 35.5 g/dL    Red Cell Distribution Width-SD 45.2 36.9 - 48.3 fL    Red Cell Distribution Width-CV 13.2 11.1 - 15.5 %    Platelet Count, Auto 181 143 - 398 x10E3/uL    Mean Platelet Volume 10.5 9.3 - 13.0 fL    Nucleated RBC%, automated 0.0 No Ref. Range %     Comment: Percent Reference Range Not Reported per accrediting agency    Absolute Nucleated RBC Count 0.00 0.00 - 0.00 x10E3/uL    Neutrophil Abs (Prelim) 3.62 See Absolute Neut Ct. x10E3/uL     Comment: This is a preliminary result.  If automated differential see Absolute Neut  Count or if manual differential see Absolute Neut Ct, Manual for final result.   Differential, Automated   Result Value Ref Range    Neutrophil Percent, Auto 52.3 No Ref. Range %     Comment: Percent reference range not reported per accrediting agency.      Lymphocyte Percent, Auto 32.0 No Ref. Range %     Comment: Percent reference range not reported per accrediting agency.      Monocyte Percent, Auto 7.5 No Ref. Range %     Comment: Percent reference range not reported per accrediting agency.      Eosinophil Percent, Auto 7.4 No Ref. Range %  Comment: Percent reference range not reported per accrediting agency.      Basophil Percent, Auto 0.7 No Ref. Range %     Comment: Percent reference range not reported per accrediting agency.      Immature Granulocytes% 0.1 No Reference Range %     Comment: Percent reference range not reported per accrediting agency.      Absolute Neut Count 3.62 1.80 - 6.90 x10E3/uL    Absolute Lymphocyte Count 2.22 1.30 - 3.40 x10E3/uL    Absolute Mono Count 0.52 0.20 - 0.80 x10E3/uL    Absolute Eos Count 0.51 (H) 0.00 - 0.50 x10E3/uL    Absolute Baso Count 0.05 0.00 - 0.10 x10E3/uL    Absolute Immature Gran Count 0.01 0.00 - 0.04 x10E3/uL   UA,Dipstick    Specimen: Clean Catch, Midstream; Urine   Result Value Ref Range    Urine Color Yellow      Specific Gravity 1.014 1.005 - 1.030    pH,Urine 6.0 5.0 - 8.0    Blood Negative Negative    Bilirubin Negative Negative    Ketones Negative Negative    Glucose Negative Negative    Protein Negative Negative    Leukocyte Esterase Negative Negative    Nitrite Negative Negative   UA,Microscopic    Specimen: Clean Catch, Midstream; Urine   Result Value Ref Range    RBC per uL 3 0 - 11 cells/uL    WBC per uL 8 0 - 22 cells/uL    RBC per HPF 1 0 - 2 cells/HPF    WBC per HPF 2 0 - 4 cells/HPF    Squamous Epi Cells 28 (H) 0 - 17 cells/uL   HIV-1/2 Ag/Ab 4th Generation with Reflex Confirmation   Result Value Ref Range    HIV-1/2 Ag/Ab Screen 4th Generation Nonreactive Nonreactive     Comment: This test was performed using the Elecsys HIV Duo assay on a Roche [id number] analyzer.         Imaging:  No results found.        ASSESSMENT AND PLAN:      Nina Cox is a 72 y.o. female who presents for   Chief Complaint   Patient presents with    Back Pain    Eye Problem     1. Mid back pain  - will check xray to eval for DDD  - pending xray results can consider PT vs ortho eval or need for further imaging  - continue topical analgesics  - avoid leaving heating pad on for prolonged periods    2. Cellulitis of back except buttock  - sulfamethoxazole-trimethoprim DOUBLE strength 800-160 mg tablet; Take 1 tablet by mouth two (2) times daily for 7 days.  Dispense: 14 tablet; Refill: 0    3. Right ankle swelling  - discussed now resolved, benign exam today  - less likely to be caused by ARB (losartan) and would expect to see bilateral swelling if related to systemic medication    4. Chest discomfort  - advised to continue cardiac work up with cardiology: pt has upcoming angiogram scheduled    5. Positive FIT (fecal immunochemical test)  - re-discussed with patient, the possibility of missing colon cancer  - pt aware, states she is afraid of any procedure which may involve needles  - will reconsider scheduling colonoscopy once she has completed angiogram    40 minutes were spent personally by me today on this encounter which include today's pre-visit review of the chart, obtaining  appropriate history, performing an evaluation, documentation and discussion of management with details supported within the note for today's visit. The time documented was exclusive of any time spent on the separately billed procedure.          RHM (to be addressed at annual wellness visit)   Health Maintenance   Topic Date Due    Shingles (Shingrix) Vaccine (1 of 2) Never done    Advance Directive  Never done    Pneumococcal Vaccine (1 - PCV) Never done COVID-19 Vaccine(Tracks primary and booster doses, not sup/immunocomp) (3 - Pfizer series) 07/23/2019    Influenza Vaccine (1) Never done    Breast Ca Screening: MAMMOGRAM  12/06/2021    Colorectal Cancer Screening  10/29/2022    Annual Preventive Wellness Visit  10/30/2022    Tdap/Td Vaccine (2 - Td or Tdap) 06/11/2027    Hepatitis B Screening  Completed    Osteoporosis Early Detection DEXA Scan  Completed    Hepatitis C Screening  Completed    Statin prescribed for ASCVD Prevention or Treatment  Completed       Vaccines:  Immunization History   Administered Date(s) Administered    COVID-19, mRNA, (Pfizer - Purple Cap) 30 mcg/0.3 mL 05/07/2019, 05/28/2019    Hepatitis B, unspecified formulation 07/05/2004    Td 04/14/2007    Tdap 06/10/2017         The above plan of care, diagnosis, orders, and follow-up were discussed with the patient.  Questions related to this recommended plan of care were answered.    There are no Patient Instructions on file for this visit.    No follow-ups on file. or sooner PRN    Future Appointments   Date Time Provider Department Center   02/26/2022  1:20 PM EIC CT01 CT [facility]   03/14/2022 12:00 PM PORTER [facility] ECHO 01 - CARDIOLOGY CI PRT [facility] [facility]   03/14/2022  1:00 PM Randel Pigg, MD CARD [address]         Signed,  Ross Ludwig, MD  Assistant Clinical Professor  Department of Medicine  [employer] [place], [place]  9:52 AM 02/07/2022

## 2022-02-14 ENCOUNTER — Ambulatory Visit: Payer: PRIVATE HEALTH INSURANCE

## 2022-02-14 DIAGNOSIS — M5134 Other intervertebral disc degeneration, thoracic region: Secondary | ICD-10-CM

## 2022-02-21 ENCOUNTER — Telehealth: Payer: PRIVATE HEALTH INSURANCE

## 2022-02-21 NOTE — Telephone Encounter
Message to Practice/Provider      Message: savanna from [facility] would like the referral for orthopedic surgery changed to [facility] and cpt code intal office visit or consult. Referral needs to be changed so that they can schedule visit.     Return call is not being requested by the patient or caller.    Patient or caller has been notified of the turnaround time of 1-2 business day(s).

## 2022-02-26 ENCOUNTER — Inpatient Hospital Stay: Payer: PRIVATE HEALTH INSURANCE | Attending: Cardiovascular Disease

## 2022-02-26 MED ADMIN — SODIUM CHLORIDE 0.9 % IV BOLUS: 65 mL | INTRAVENOUS | @ 22:00:00 | Stop: 2022-02-26 | NDC 00338004918

## 2022-02-26 MED ADMIN — IOPAMIDOL 76 % IV SOLN: 65 mL | INTRAVENOUS | @ 22:00:00 | Stop: 2022-02-26 | NDC 00270131652

## 2022-02-26 MED ADMIN — NITROGLYCERIN 0.4 MG SL SUBL: .4 mg | SUBLINGUAL | @ 22:00:00 | Stop: 2022-02-27 | NDC 59762330403

## 2022-02-26 NOTE — Nursing Note
1325- Pt. arrived for coronary angiogram, no history of aortic stenosis, aortic coarctation.  1346- CTA in progress.  1350- Imaging completed. Pt. tolerated procedure well, denies HA or dizziness appears in NAD. Pt. ambulatory from CT with steady gait.

## 2022-03-05 ENCOUNTER — Ambulatory Visit: Payer: PRIVATE HEALTH INSURANCE

## 2022-03-12 DIAGNOSIS — E785 Hyperlipidemia, unspecified: Secondary | ICD-10-CM

## 2022-03-12 DIAGNOSIS — I2510 Atherosclerotic heart disease of native coronary artery without angina pectoris: Secondary | ICD-10-CM

## 2022-03-13 NOTE — Progress Notes
PRIMARY CARE PHYSICIAN: Beaulah Dinning., MD    OUTPATIENT CARDIOLOGY FOLLOW UP PROGRESS NOTE    Subjective: Nina Cox is a pleasant 72 year old female with history of coronary artery disease, uncontrolled hypertension and dyslipidemia presents to Cardiology Clinic for follow up visit.  She was lasting prior to her breast implant surgery by Dr. Elsworth Soho in 2 days from now. She is very active physically and sometimes she had chest discomfort in the middle of her chest.  These episodes are not necessarily exertional and lasts for a few minutes.  As a result we got a coronary CT angiogram which showed nonobstructive CAD detailed below.  Her blood pressure is not well controlled and her family physician increase her losartan to 50 mg but she has not been taking the medication regularly and did not take it last night.  I also put her on atorvastatin and aspirin but she has not started the medication yet.  She was prescribed hydrochlorothiazide in the past but never took the medications since she is not a fan of too many medications.  She denies having any shortness of breath, palpitations, orthopnea, presyncope or syncope.     Review of Systems:  Gen: no fevers, chills, or night sweats.  HEENT: no acute visual or hearing changes; no new oral lesions or excess secretions.  CV: no chest pain, orthopnea, PND, palpitations, or syncope.  Resp: no SOB, DOE, cough, or hemoptysis.  GI: no diarrhea, constipation, hematochezia, or melena.  GU: no dysuria, urgency, or hematuria.  Musc: no acute joint swelling or pain.  Skin: no acute rashes.  Neuro: no acute numbness, weakness, or seizures.  Psych: no anxiety or depression.  Endocrine: no heat or cold intolerance.  Heme/Lymph: no spontaneous bruising, bleeding, or lymph node swelling.  Allergic/Immunologic: no acute allergic reactions or hives.    Past Medical History:   Diagnosis Date    Glaucoma     Hypertension        Past Surgical History:   Procedure Laterality Date BREAST SURGERY         Family History   Problem Relation Age of Onset    Coronary artery disease Father        Social History     Tobacco Use    Smoking status: Never    Smokeless tobacco: Never   Vaping Use    Vaping Use: Never used   Substance Use Topics    Alcohol use: Yes    Drug use: No       Allergies   Allergen Reactions    Amlodipine      Other reaction(s): Other  Edema        Medications that the patient states to be currently taking   Medication Sig    ALBUTEROL 90 mcg/act inhaler INHALE 1 PUFF BY MOUTH EVERY 4 HOURS AS NEEDED FOR SHORTNESS OF BREATH OR WHEEZING    losartan 50 mg tablet Not taking    LUMIGAN 0.01 % ophthalmic solution Place 1 drop into both eyes at bedtime.         EXAM:  BP 172/82  ~ Pulse 61  ~ Resp 18  ~ Wt 123 lb (55.8 kg)  ~ LMP  (LMP Unknown)  ~ SpO2 96%  ~ BMI 24.84 kg/m?       10/30/2021     2:36 PM 12/10/2021     1:36 PM 12/27/2021     2:01 PM 02/07/2022     9:47 AM 02/26/2022  1:35 PM 02/26/2022     1:48 PM 03/14/2022    12:55 PM   Vitals - 1 value per visit   SYSTOLIC 175 154 161 133 168 130 172   DIASTOLIC 79 74 85 66 85 62 82   Heart Rate 61 68  77 63 60 61   Temp  36.6 ?C (97.9 ?F)  36.6 ?C (97.9 ?F)      Resp  16  16 18 18 18    Weight (lb) 125   121   123   Height 4' 11'' (1.499 m)   4' 11'' (1.499 m)      BMI 25.25 kg/m2   24.44 kg/m2   24.84 kg/m2   SpO2 96 % 96 %  93 %   96 %   Visit Report  Report  Report   Report       GEN: awake and alert x3, no acute distress. Able to converse.  HEENT: PERRL, EOMI,moist conjunctivae, oral cavity clear; ears and nose atraumatic.   NECK: No significant JVD; +2 carotids bilaterally with no significant bruits.   HEART: RRR with normal S1 and S2, II/VI systolic murmur noted  LUNGS: CTAB bilaterally with no rales or wheezes. Normal respiratory effort.  ABD: soft, NT, ND, +BS.   EXT: non-tender with no edema   MUSC: no joint swelling or effusions with normal range of movement of upper and lower extremities.  SKIN: no acute rashes.    ASCVD Risk Estimation  22.1% is the estimated 10-year risk of atherosclerotic cardiovascular disease (ASCVD) as of 1:12 PM on 03/14/2022  Values used to calculate ASCVD score:  Age: 72 y.o.    Gender: Female Race: Not African American.  62 mg/dL. (measured on 10/23/2021)  151 mg/dL. (measured on 10/23/2021)  172 mm Hg. (measured on 03/14/2022)  Yes  currently not a smoker  No  Click here for the 2013 ACC/AHA Cholesterol Treatment Guideline Summary (PDF).  Click here for the 2013 ACC/AHA Cardiovascular Risk Estimator tool Office manager).    Lab Results   Component Value Date    WBC 6.93 10/23/2021    HGB 17.4 (H) 10/23/2021    HCT 49.7 (H) 10/23/2021    MCV 94.1 10/23/2021    PLT 181 10/23/2021     Lab Results   Component Value Date    CREAT 0.50 (L) 02/20/2022    BUN 20 02/20/2022    NA 141 02/20/2022    K 4.6 02/20/2022    CL 103 02/20/2022    CO2 23 02/20/2022     Lab Results   Component Value Date    HGBA1C 5.4 10/23/2021     Lab Results   Component Value Date    ALT 36 10/23/2021    AST 49 10/23/2021    ALKPHOS 41 10/23/2021    BILITOT 0.5 10/23/2021     Lab Results   Component Value Date    TSH 1.2 10/23/2021     Lab Results   Component Value Date    CALCIUM 9.9 02/20/2022     Lab Results   Component Value Date    CHOL 151 10/23/2021    CHOLHDL 62 10/23/2021    CHOLDLCAL 79 10/23/2021    TRIGLY 52 10/23/2021     Chest CT 08/24/2020: personally reviewed and interpreted, calcified plaques seen in the aorta and left main/ostial LAD      ECG 10/22/2021:  personally reviewed and interpreted. NSR, VR 69 bpm, PR interval 176 ms, QRS duration 80  ms, QTc 441 ms.      Coronary CTA 02/26/2022: personally reviewed and interpreted.      IMPRESSION: Good-quality CT coronary angiogram with no non evaluable segments.  Calcified plaque at the proximal LAD resulting in mild (under 50%) stenosis. Otherwise, the coronary artery anatomy is conventional without high-grade stenosis.  Peripherally calcified bilateral breast prostheses. Unchanged ovoid soft tissue nodule posterior the left silicone implant with internal calcifications. While this finding is unchanged from 2022 prior, consider follow-up with dedicated breast imaging for further evaluation.     Echocardiogram today 03/14/2022: Performed in clinic and personally reviewed and interpreted.    CONCLUSIONS   1. Normal left ventricular size, wall thickness and function (EF 70%).   2. Normal right ventricular size and systolic function.   3. Mild aortic regurgitation.   4. There are no prior studies on this patient for comparison purposes.    ASSESSMENT: Aubery Date is a pleasant 72 year old female with history of coronary artery disease, uncontrolled hypertension and dyslipidemia presents to Cardiology Clinic for follow up visit.      PLAN:  #Coronary artery disease:  Seen on chest CT, nonobstructive  - agreed to start aspirin 81 mg after her procedure  - agreed to take atorvastatin 10 mg q.h.s. the importance of taking the medications discussed with the patient in detail    #Hypertension:  Not well controlled, she says she has not been taking her losartan  - agreed to take losartan 50 mg regularly  - home blood pressure monitoring instructed  - she will notify me if her blood pressure is elevated at home    #Dyslipidemia: LDL 79 goal < 70  - agreed to take atorvastatin 10 mg q.h.s.  - will repeat her fasting lipid panel profile in 3 months    #Aortic Regurgitation:  New diagnosis.  Discussed with the patient in detail  - will make sure her blood pressure is well controlled and will monitor periodically    #Follow up: 3 months from now    Thank you for the opportunity to continue to participate in your patient's care.    Antony Haste, MD  [facility] [employer]  Dept. of Medicine, Div. of Cardiology  [facility]  [address],   [place], [place] [address]

## 2022-03-14 ENCOUNTER — Ambulatory Visit: Payer: PRIVATE HEALTH INSURANCE | Attending: Cardiovascular Disease

## 2022-03-14 DIAGNOSIS — Z91148 Noncompliance with medication regimen: Secondary | ICD-10-CM

## 2022-03-14 DIAGNOSIS — I351 Nonrheumatic aortic (valve) insufficiency: Secondary | ICD-10-CM

## 2022-03-14 MED ORDER — ASPIRIN 81 MG PO TBEC
81 mg | ORAL_TABLET | Freq: Every day | ORAL | 3 refills | 30.00000 days | Status: AC
Start: 2022-03-14 — End: ?

## 2022-03-20 ENCOUNTER — Ambulatory Visit: Payer: PRIVATE HEALTH INSURANCE | Attending: Pain Medicine

## 2022-03-20 DIAGNOSIS — M47814 Spondylosis without myelopathy or radiculopathy, thoracic region: Secondary | ICD-10-CM

## 2022-03-20 DIAGNOSIS — M5414 Radiculopathy, thoracic region: Secondary | ICD-10-CM

## 2022-03-20 DIAGNOSIS — M5134 Other intervertebral disc degeneration, thoracic region: Secondary | ICD-10-CM

## 2022-03-20 NOTE — Progress Notes
Date:  03/20/2022    Name:  Nina Cox  MRN#:  [id number]  DOB:  [birth year]  Age:  72 y.o.      Daleen Snook, D.O.  PHYSICAL MEDICINE & REHABILITATION   INTERVENTIONAL SPINE & SPORTS MEDICINE       Dear Dr. Betti Cruz, Cheree Ditto., MD,    I had the pleasure of seeing your patient Nina Cox for initial evaluation. Please find my office note below.     The patient was seen in [facility] ([facility].                                                         Chief Complaint   Patient presents with    Middle Back - Pain       History:    Nina Cox is a very pleasant 72 y.o. female presenting to my office with the above chief complaint(s). The pain is  Intermittent sharp pain localized to the mid back and does not radiate. The pain initially began 1 year ago with no inciting event. The pain ranges from a 7 to 10/10. It is aggravated by  getting up from bed . Patient reports she is unable to lay completely flat as it is too painful. It is alleviated by rest . Patient tried  chiropractic care without success. Patient reports associated pain. Denies any focal neurologic weakness, numbness/tingling, radicular symptoms or loss of bowel/bladder function. The patient denies having had these symptoms in the past.     Since start of symptoms, patient reports overall symptoms are  worse     The patient denies doing any PT for the symptoms.     The pain is significantly impacting the patient?s ability to perform activities of daily living.        PMHx:    Past Medical History:   Diagnosis Date    Glaucoma     Hypertension        PSugHX:    Past Surgical History:   Procedure Laterality Date    BREAST SURGERY         Soc HX:   Marital status: separated   Living situation: alone   Social History     Tobacco Use    Smoking status: Never    Smokeless tobacco: Never   Vaping Use    Vaping Use: Never used   Substance Use Topics    Alcohol use: Yes    Drug use: No       Fam HX:   Family History Problem Relation Age of Onset    Coronary artery disease Father        Current Meds:   Current Outpatient Medications:     ALBUTEROL 90 mcg/act inhaler, INHALE 1 PUFF BY MOUTH EVERY 4 HOURS AS NEEDED FOR SHORTNESS OF BREATH OR WHEEZING, Disp: 8.5 g, Rfl: 2    aspirin 81 mg EC tablet, Take 1 tablet (81 mg total) by mouth daily., Disp: 90 tablet, Rfl: 3    aspirin 81 mg EC tablet, Take 1 tablet (81 mg total) by mouth daily., Disp: 90 tablet, Rfl: 3    atorvastatin 10 mg tablet, Take 1 tablet (10 mg total) by mouth at bedtime., Disp: 90 tablet, Rfl: 3    losartan 50 mg tablet, Take 1  tablet (50 mg total) by mouth daily., Disp: 90 tablet, Rfl: 3    LUMIGAN 0.01 % ophthalmic solution, Place 1 drop into both eyes at bedtime., Disp: 7.5 mL, Rfl: 1    METOPROLOL TARTRATE 25 mg tablet, TAKE 1 TABLET BY MOUTH 2-3 HOURS BEFORE YOUR CORONARY CT ANGIOGRAM, Disp: 1 tablet, Rfl: 0    prednisoLONE acetate 1% ophthalmic suspension, , Disp: , Rfl:     lidocaine 5% ointment, APPLY TOPICALLY TO THE AFFECTED AREA 1 TIME FOR 1 DOSE (Patient not taking: Reported on 12/10/2021.), Disp: , Rfl:     Allergies:   Allergies   Allergen Reactions    Amlodipine      Other reaction(s): Other  Edema        ROS:  Constitutional: denies fever, chills, unintended weight loss/gain or diaphoresis    Eyes: denies changes in vision, blurry vision   ENT: denies HA, hearing loss, cough, sore throat, rhinorrhea  CV: denies CP, palpitations, orthopnea, leg swelling, PND or DOE    Resp: denies SOB, cough  GI: denies abd pain, N/V  Bowel: denies diarrhea, constipation; denies incontinence or saddle numbness  GU/renal: denies dysuria, hematuria; denies incontinence or saddle numbness   Skin: denies rashes or pressure ulcers  Endo/Heme/Aller: denies hot/cold intolerance; bleeding disorders  Neurological: denies dizziness, tingling, tremor, sensory change, seizures   Psychiatric: denies emotional or psychiatric issues  MSK: see HPI  Denies  joint swelling, joint redness   Pain: see HPI     Remaining 10 point review of systems conducted and negative.     Physical Exam:    BP Readings from Last 3 Encounters:   03/14/22 172/82   02/26/22 130/62   02/07/22 133/66     Pulse Readings from Last 3 Encounters:   03/14/22 61   02/26/22 60   02/07/22 77     Wt Readings from Last 3 Encounters:   03/20/22 123 lb 0.3 oz (55.8 kg)   03/14/22 123 lb (55.8 kg)   02/07/22 121 lb (54.9 kg)     Estimated body mass index is 24.85 kg/m? as calculated from the following:    Height as of this encounter: 4' 11'' (1.499 m).    Weight as of this encounter: 123 lb 0.3 oz (55.8 kg).  Estimated body surface area is 1.52 meters squared as calculated from the following:    Height as of this encounter: 4' 11'' (1.499 m).    Weight as of this encounter: 123 lb 0.3 oz (55.8 kg).      Gen: A+O x 3 in NAD  Psych: Normal mood and affect. Responds appropriately to commands  Eyes: Anicteric. No discharge. EOMI.  Resp: Breathing unlabored. Clear to auscultation b/l  CV: RRR. Well perfused. No varicosities noted  Ext: No c/c/e  Skin: No lesions noted  Gait; Non antalgic, normal reciprocating heel to toe, able to stand on toes and heels.     Inspection: Spine alignment is midline, with no evidence of scoliosis.  Palpation: TTP T9-T12. There is no tenderness over the midline spinous processes, paravertebral muscles, PSIS, sciatic notch, greater trochanters bilaterally.  Lumbar ROM: Flexion, extension, side-bending, rotation, all full and painless.      Stance: No Trendelenburg with single leg stance.     Hip Flex Knee Ext Ankle Dorsi EHL Ankle Plantar   Right 5/5 5/5 5/5 5/5 5/5   Left 5/5 5/5 5/5 5/5 5/5     Tone: Normal. No clonus.  Sensation: Grossly intact to light  touch and pinprick bilateral lower limbs.  Proprioception: Intact at big toes bilaterally.  Reflexes: 2+ symmetric knee jerk, ankle jerk. Plantars downgoing bilaterally.  Provocative:  (-) Straight Leg Raise,  (-) Seated Slump,  (-) Kemp's,  (-) Facet Loading, (-) Femoral nerve stretch,  (-) FABER for SI joint, (-) Gaenslen's,  (-) SI joint compression, (-) SI joint distraction,  (-) Thigh Thrust    Hip ROM: Full and painless bilaterally.  FAIR, FABERE negative bilaterally.  No tenderness is noted with palpation of the greater trochanters or ischial tuberosity  Resisted knee flexion did not reproduced pain in the ischial tuberosity region.    Imaging:     I independently reviewed the 3 view x-rays of the thoracic spine from 02/07/22 in the office today. Per my interpretation multilevel spondylosis and disc degeneration most notably at T9-T10 and T11-T12. Official radiology report below.     IMPRESSION:     Unchanged diffuse degenerative disc disease, severe at T9-T10 and T11-T12, and mild at the other levels.   No compression fracture or subluxation.  Vascular calcification.      Assessment/Plan:    Nina Cox is a 72 y.o. female with thoracic back pain with intermittent radicular symptoms in the setting of thoracic disc degeneration and spondylosis.      -Pt educated regarding diagnosis(es) and treatment options  -Tiers of treatment discussed  -Optimal diet, weight, sleep, and lifestyle management to minimize stress and maximize well being counseling provided  -Imaging reviewed and discussed with pt (see above for my interpretation)  -Start physical therapy 2x/wk for 6-8 wks, with goal toward HEP  -Cool compresses or warm heat to affected regions prn  - Tylenol 1000 mg PRN   - MRI of the thoracic spine without contrast is indicated given the patient has failed oral NSAIDs, tylenol, and has severe 10/10 pain limiting their ability to effectively participate in physical therapy and complete their ADLs.   - I recommend the patient continue utilizing multimodal strategies, perform gentle exercises, perform home exercises, engage in formal physical therapy as tolerated, and contact us for additional care.         RTO after MRI is complete to review    MEDICAL DECISION MAKING     Spinal discomfort and radiculopathy are multifactorial.  To complete the evaluation, an examination was performed, and imaging studies were reviewed.  The medical history, review of systems and medications were reviewed.   I assessed spinal and non-spinal causes of symptoms including musculoskeletal, vascular causes, visceral causes, infections, endocrine abnormalities, medications, and tumors as the differential diagnosis of spinal pain is extensive and includes, but is not limited to, soft tissue injuries (sprains and strains), degenerative and post traumatic conditions (disk herniations and spinal stenosis), vascular conditions (aortic aneurysms), endocrinologic conditions (osteoporosis and hyperparathyroidism), neoplastic conditions (primary and secondary malignancies of the spine), congenital and developmental conditions (scoliosis and spondylolisthesis), rheumatologic conditions (ankylosing spondylitis, psoriatic arthritis, and rheumatoid arthritis), metabolic conditions (diabetic neuropathy), hematologic conditions (sickle cell anemia and polycythemia vera), infectious diseases (diskitis, osteomyelitis, and meningitis), neurologic diseases (multiple sclerosis and amyotrophic lateral sclerosis), and renal disease (kidney stones).       This added to the overall medical complexity of this visit.      Thank you for allowing me to participate in the care of your patient. Please do not hesitate to reach out to me with any questions or concerns.     Sincerely,        Daleen Snook, DO  Physical Medicine & Rehabilitation  Interventional Spine & Sports Medicine

## 2022-04-09 ENCOUNTER — Ambulatory Visit: Payer: PRIVATE HEALTH INSURANCE

## 2022-04-14 ENCOUNTER — Ambulatory Visit: Payer: PRIVATE HEALTH INSURANCE | Attending: Rehabilitative and Restorative Service Providers"

## 2022-04-16 ENCOUNTER — Ambulatory Visit: Payer: PRIVATE HEALTH INSURANCE | Attending: Pain Medicine

## 2022-05-16 ENCOUNTER — Ambulatory Visit: Payer: PRIVATE HEALTH INSURANCE | Attending: Rehabilitative and Restorative Service Providers"

## 2022-05-19 ENCOUNTER — Ambulatory Visit: Payer: PRIVATE HEALTH INSURANCE

## 2022-06-27 ENCOUNTER — Ambulatory Visit: Payer: PRIVATE HEALTH INSURANCE

## 2022-06-27 ENCOUNTER — Inpatient Hospital Stay: Payer: PRIVATE HEALTH INSURANCE

## 2022-07-14 DIAGNOSIS — I351 Nonrheumatic aortic (valve) insufficiency: Secondary | ICD-10-CM

## 2022-07-14 DIAGNOSIS — I2510 Atherosclerotic heart disease of native coronary artery without angina pectoris: Secondary | ICD-10-CM

## 2022-07-14 DIAGNOSIS — E785 Hyperlipidemia, unspecified: Secondary | ICD-10-CM

## 2022-07-15 ENCOUNTER — Ambulatory Visit: Payer: PRIVATE HEALTH INSURANCE | Attending: Cardiovascular Disease

## 2022-07-15 MED ORDER — HYDROCHLOROTHIAZIDE 12.5 MG PO TABS
12.5 mg | ORAL_TABLET | Freq: Every day | ORAL | 3 refills | Status: AC
Start: 2022-07-15 — End: ?

## 2022-07-15 MED ORDER — ATORVASTATIN CALCIUM 10 MG PO TABS
10 mg | ORAL_TABLET | Freq: Every evening | ORAL | 3 refills | Status: AC
Start: 2022-07-15 — End: ?

## 2022-07-15 NOTE — Progress Notes
PRIMARY CARE PHYSICIAN: Kathrine Haddock, MD    OUTPATIENT CARDIOLOGY FOLLOW UP PROGRESS NOTE    Subjective: Nina Cox is a pleasant 72 year old female with history of coronary artery disease, uncontrolled hypertension and dyslipidemia presents to Cardiology Clinic for follow up visit.  Since her last visit she has been stable cardiac standpoint brought her blood pressure continues to be elevated.  She has been on losartan 50 mg and has been taking the medication for the past few days. She is very active physically and sometimes she had chest discomfort in the middle of her chest.  These episodes are not necessarily exertional and lasts for a few minutes.  As a result we got a coronary CT angiogram which showed nonobstructive CAD detailed below.   I also put her on atorvastatin and aspirin but she has not not been taking her atorvastatin regularly. She denies having any shortness of breath, palpitations, orthopnea, presyncope or syncope.     Review of Systems:  Gen: no fevers, chills, or night sweats.  HEENT: no acute visual or hearing changes; no new oral lesions or excess secretions.  CV: no chest pain, orthopnea, PND, palpitations, or syncope.  Resp: no SOB, DOE, cough, or hemoptysis.  GI: no diarrhea, constipation, hematochezia, or melena.  GU: no dysuria, urgency, or hematuria.  Musc: no acute joint swelling or pain.  Skin: no acute rashes.  Neuro: no acute numbness, weakness, or seizures.  Psych: no anxiety or depression.  Endocrine: no heat or cold intolerance.  Heme/Lymph: no spontaneous bruising, bleeding, or lymph node swelling.  Allergic/Immunologic: no acute allergic reactions or hives.    Past Medical History:   Diagnosis Date    Glaucoma     Hypertension        Past Surgical History:   Procedure Laterality Date    BREAST SURGERY         Family History   Problem Relation Age of Onset    Coronary artery disease Father        Social History     Tobacco Use    Smoking status: Never    Smokeless tobacco: Never   Vaping Use    Vaping status: Never Used   Substance Use Topics    Alcohol use: Yes    Drug use: No       Allergies   Allergen Reactions    Amlodipine      Other reaction(s): Other  Edema      Outpatient Medications Prior to Visit   Medication Sig    ALBUTEROL 90 mcg/act inhaler INHALE 1 PUFF BY MOUTH EVERY 4 HOURS AS NEEDED FOR SHORTNESS OF BREATH OR WHEEZING    aspirin 81 mg EC tablet Take 1 tablet (81 mg total) by mouth daily.    atorvastatin 10 mg tablet Take 1 tablet (10 mg total) by mouth at bedtime.    losartan 50 mg tablet Take 1 tablet (50 mg total) by mouth daily.    prednisoLONE acetate 1% ophthalmic suspension          lidocaine 5% ointment APPLY TOPICALLY TO THE AFFECTED AREA 1 TIME FOR 1 DOSE (Patient not taking: Reported on 12/10/2021.)    LUMIGAN 0.01 % ophthalmic solution Place 1 drop into both eyes at bedtime. (Patient not taking: Reported on 07/15/2022.)          No facility-administered medications prior to visit.           EXAM:  BP 152/57  ~ Pulse 65  ~  Resp 17  ~ Ht 4' 11'' (1.499 m)  ~ Wt 123 lb (55.8 kg) Comment: per pt ~ LMP  (LMP Unknown)  ~ SpO2 94%  ~ BMI 24.84 kg/m?       12/27/2021     2:01 PM 02/07/2022     9:47 AM 02/26/2022     1:35 PM 02/26/2022     1:48 PM 03/14/2022    12:55 PM 03/20/2022     2:40 PM 07/15/2022     1:09 PM   Vitals - 1 value per visit   SYSTOLIC 137 133 161 130 172  096   DIASTOLIC 85 66 85 62 82  57   Heart Rate  77 63 60 61  65   Temp  36.6 ?C (97.9 ?F)        Resp  16 18 18 18  17    Weight (lb)  121   123 123.02 123   Height  4' 11'' (1.499 m)    4' 11'' (1.499 m) 4' 11'' (1.499 m)   BMI  24.44 kg/m2   24.84 kg/m2 24.85 kg/m2 24.84 kg/m2   SpO2  93 %   96 %  94 %   Visit Report  Report   Report  Report       GEN: awake and alert x3, no acute distress. Able to converse.  HEENT: PERRL, EOMI,moist conjunctivae, oral cavity clear; ears and nose atraumatic.   NECK: No significant JVD; +2 carotids bilaterally with no significant bruits.   HEART: RRR with normal S1 and S2, II/VI systolic murmur noted  LUNGS: CTAB bilaterally with no rales or wheezes. Normal respiratory effort.  ABD: soft, NT, ND, +BS.   EXT: non-tender with no edema   MUSC: no joint swelling or effusions with normal range of movement of upper and lower extremities.  SKIN: no acute rashes.    ASCVD Risk Estimation  19.7% is the estimated 10-year risk of atherosclerotic cardiovascular disease (ASCVD) as of 1:33 PM on 07/15/2022  Values used to calculate ASCVD score:  Age: 72 y.o.    Gender: Female Race: Not African American.  77 mg/dL. (measured on 07/14/2022)  161 mg/dL. (measured on 07/14/2022)  152 mm Hg. (measured on 07/15/2022)  Yes  currently not a smoker  No  Click here for the 2013 ACC/AHA Cholesterol Treatment Guideline Summary (PDF).  Click here for the 2013 ACC/AHA Cardiovascular Risk Estimator tool Office manager).    Lab Results   Component Value Date    WBC 6.93 10/23/2021    HGB 17.4 (H) 10/23/2021    HCT 49.7 (H) 10/23/2021    MCV 94.1 10/23/2021    PLT 181 10/23/2021     Lab Results   Component Value Date    CREAT 0.50 (L) 02/20/2022    BUN 20 02/20/2022    NA 141 02/20/2022    K 4.6 02/20/2022    CL 103 02/20/2022    CO2 23 02/20/2022     Lab Results   Component Value Date    HGBA1C 5.4 10/23/2021     Lab Results   Component Value Date    ALT 36 10/23/2021    AST 49 10/23/2021    ALKPHOS 41 10/23/2021    BILITOT 0.5 10/23/2021     Lab Results   Component Value Date    TSH 1.2 10/23/2021     Lab Results   Component Value Date    CALCIUM 9.9 02/20/2022     Lab Results  Component Value Date    CHOL 161 07/14/2022    CHOLHDL 77 07/14/2022    CHOLDLCAL 70 07/14/2022    TRIGLY 69 07/14/2022     Chest CT 08/24/2020: personally reviewed and interpreted, calcified plaques seen in the aorta and left main/ostial LAD      ECG 10/22/2021:  personally reviewed and interpreted. NSR, VR 69 bpm, PR interval 176 ms, QRS duration 80 ms, QTc 441 ms.      Coronary CTA 02/26/2022: personally reviewed and interpreted. IMPRESSION: Good-quality CT coronary angiogram with no non evaluable segments.  Calcified plaque at the proximal LAD resulting in mild (under 50%) stenosis. Otherwise, the coronary artery anatomy is conventional without high-grade stenosis.  Peripherally calcified bilateral breast prostheses. Unchanged ovoid soft tissue nodule posterior the left silicone implant with internal calcifications. While this finding is unchanged from 2022 prior, consider follow-up with dedicated breast imaging for further evaluation.     Echocardiogram 03/14/2022: personally reviewed and interpreted.    CONCLUSIONS   1. Normal left ventricular size, wall thickness and function (EF 70%).   2. Normal right ventricular size and systolic function.   3. Mild aortic regurgitation.   4. There are no prior studies on this patient for comparison purposes.    ASSESSMENT: Nina Cox is a pleasant 72 year old female with history of coronary artery disease, uncontrolled hypertension and dyslipidemia presents to Cardiology Clinic for follow up visit.      PLAN:  #Coronary artery disease:  Seen on chest CT, nonobstructive  -continue aspirin 81 mg after her procedure  - agreed to take atorvastatin 10 mg q.h.s regularly. the importance of taking the medications discussed with the patient in detail    #Hypertension:  Not well controlled, I rechecked her blood pressure after a few minutes of resting and was 173/81 mmHg, did not tolerate amlodipine causing leg swelling  - continue losartan 50 mg regularly  - will add hydrochlorothiazide 12.5 mg to her regimen.  - home blood pressure monitoring instructed  - she will notify me if her blood pressure is elevated at home    #Dyslipidemia: LDL 79 -> 70  - agreed to take atorvastatin 10 mg q.h.s. regularly, a refill was sent to her pharmacy  - will continue to monitor    #Aortic Regurgitation:  New diagnosis.  Discussed with the patient in detail  - will make sure her blood pressure is well controlled and will monitor periodically    #Follow up: 3 months from now    Thank you for the opportunity to continue to participate in your patient's care.    Antony Haste, MD  [facility] [employer]  Dept. of Medicine, Div. of Cardiology  [facility]  [address],   [place], [place] [address]

## 2022-08-08 ENCOUNTER — Ambulatory Visit: Payer: PRIVATE HEALTH INSURANCE

## 2022-08-08 DIAGNOSIS — K1379 Other lesions of oral mucosa: Secondary | ICD-10-CM

## 2022-08-08 DIAGNOSIS — L239 Allergic contact dermatitis, unspecified cause: Secondary | ICD-10-CM

## 2022-08-08 DIAGNOSIS — B009 Herpesviral infection, unspecified: Secondary | ICD-10-CM

## 2022-08-08 MED ORDER — VALACYCLOVIR HCL 1 G PO TABS
ORAL_TABLET | 0 refills | Status: AC
Start: 2022-08-08 — End: ?

## 2022-08-08 MED ORDER — LIDOCAINE 5 % EX OINT
Freq: Once | TOPICAL | 0 refills | 30.00000 days | Status: AC
Start: 2022-08-08 — End: ?

## 2022-08-08 NOTE — Patient Instructions
It was a pleasure seeing you today Nina Cox! Thank you for allowing me to be part of your care team at [facility].    Our plan, as discussed today is as follows:    Please obtain blood work as requested. I will contact you via [email or web address] with the results  Prescriptions sent to your pharmacy on file    If you have any questions, please feel free to reach out to our office at [phone] to make an appointment. For non-urgent questions you can always send me a message on [facility]. I look forward to seeing you next time!    Warm Regards,  Lowella Dell

## 2022-08-08 NOTE — Progress Notes
[facility] INTERNAL MEDICINE   [facility]  [address]  [place] [address]  Phone: [phone]  FAX: [phone]  [phone]    PATIENT: Nina Cox  MRN: [id number]  DOB: 04-28-1950  DATE OF SERVICE: 08/08/2022  CHIEF COMPLAINT:   Chief Complaint   Patient presents with    Mouth Lesions        HPI   Nina Cox is a 72 y.o. female presents for the following:    Concerned about a growth on the L side of her lip which has been intermittent for the last few months  Tried putting an OTC cream on it (unsure which) with minimal improvement  Crusting over at this time    PMH     Patient Active Problem List   Diagnosis    Essential hypertension    Hyperlipidemia    Prediabetes    Nevus of choroid    Insufficiency, tear film    Chronic nonalcoholic liver disease    Primary open angle glaucoma (POAG) of both eyes    Hardening of the aorta (main artery of the heart) (HCC/RAF)    Abnormal tomography of chest    Atherosclerosis of native coronary artery of native heart    Aortic regurgitation    Noncompliance with medication regimen     PSxH     Past Surgical History:   Procedure Laterality Date    BREAST SURGERY       ALL     Allergies   Allergen Reactions    Amlodipine      Other reaction(s): Other  Edema      MEDS     Medications that the patient states to be currently taking   Medication Sig    ALBUTEROL 90 mcg/act inhaler INHALE 1 PUFF BY MOUTH EVERY 4 HOURS AS NEEDED FOR SHORTNESS OF BREATH OR WHEEZING    aspirin 81 mg EC tablet Take 1 tablet (81 mg total) by mouth daily.    atorvastatin 10 mg tablet Take 1 tablet (10 mg total) by mouth at bedtime.    doxycycline hyclate 20 mg tablet Take 1 tablet (20 mg total) by mouth two (2) times daily.    hydroCHLOROthiazide 12.5 MG tablet Take 1 tablet (12.5 mg total) by mouth daily.    losartan 50 mg tablet Take 1 tablet (50 mg total) by mouth daily.    MIEBO 1.338 GM/ML SOLN INSTILL ONE DROP INTO EACH EYE FOUR TIMES DAILY prednisoLONE acetate 1% ophthalmic suspension      [facility]     Family History   Problem Relation Age of Onset    Coronary artery disease Father      Social History     Tobacco Use    Smoking status: Never    Smokeless tobacco: Never   Substance Use Topics    Alcohol use: Yes     ROS     (Check if Normal, or note + findings):      [x]  14-point ROS is negative except for that mentioned in the HPI   []  Not Obtainable:   [x]  Constit  [x]  Skin/breast    [x]  Eyes  [x]  CV   [x]  MS    [x]  ENMT  [x]  GI   [x]  Heme/Lymph     [x]  Resp  [x]  GU  [x]  Neuro    [x]  Imm/All   [x]  Endo  [x]  Psych         PHYSICAL  EXAM     Last Recorded Vital Signs:    08/08/22 1644   BP: 149/67   Pulse: 67   Resp: 16   Temp: 36.6 ?C (97.8 ?F)   SpO2: 96%     Wt Readings from Last 3 Encounters:   07/15/22 123 lb (55.8 kg)   03/20/22 123 lb 0.3 oz (55.8 kg)   03/14/22 123 lb (55.8 kg)     BMI Readings from Last 3 Encounters:   07/15/22 24.84 kg/m?   03/20/22 24.85 kg/m?   03/14/22 24.84 kg/m?       Check if Normal, or note + findings    Const Vitals BP 149/67  ~ Pulse 67  ~ Temp 36.6 ?C (97.8 ?F) (Oral)  ~ Resp 16  ~ LMP  (LMP Unknown)  ~ SpO2 96%       Gen [x] WD/WN   [x]  NAD GI []  Abd  []  Rect []  HSM   Eyes []  Conj/Lids []  Pupils  []  Hernia []  Guard/Rebnd   ENT []  Ears/otosc    []  Oroph   []  Nasal Muc  []  Hearing GU:Fem  GM:WNUU []  Ext   []  Vag      []  Pen  []  Scro    []  Cerv []  Uter/Ad [] Deferred  []  Prost []  Deferred   Neck []  Inspect/Palp   []  Thyroid Neuro  []  CN2-12   Breast []  Inspect   []  Palpation  []  DTR []  Sen   Resp [x]  Effort [x]  Ascult []  Percuss MSK [x]  Gait   []  ROM  []  Tone/motor []  Insp/palp  []  Back  []  Jt stable   CV [x]  Auscultate []  Carotids []  Palp       [x]  Edema Puls: []  Pedal []  Fem  Skin [x]  Inspect   []  Palp   Lymph [x]  Neck  []  Axillary  []   Groin Psych [x]  A&O  [x]  Insight/judg  []  Affect []  Cog/recall       LABS/STUDIES   I have     [x] reviewed/ordered radiology, [x] reviewed/ordered labs, [] reviewed/ordered diag med test   []  independent interpretation of study, [] decided to get outside medical records    [x] reviewed & summarized old records or obtain from someone other than patient   [] obtained history from someone other than patient [] review test with performing physician       Lab Results   Component Value Date    WBC 6.93 10/23/2021    HGB 17.4 (H) 10/23/2021    HCT 49.7 (H) 10/23/2021    MCV 94.1 10/23/2021    PLT 181 10/23/2021     Lab Results   Component Value Date    NA 141 02/20/2022    K 4.6 02/20/2022    CL 103 02/20/2022    CO2 23 02/20/2022    CREAT 0.50 (L) 02/20/2022    BUN 20 02/20/2022     Lab Results   Component Value Date    ALT 36 10/23/2021    AST 49 10/23/2021    ALKPHOS 41 10/23/2021    BILITOT 0.5 10/23/2021     Lab Results   Component Value Date    CALCIUM 9.9 02/20/2022     Lab Results   Component Value Date    TSH 1.2 10/23/2021     No results found for: ''[facility]''  Lab Results   Component Value Date    CHOL 161 07/14/2022    CHOLHDL 77 07/14/2022    CHOLDLCAL 70 07/14/2022    TRIGLY 69 07/14/2022  Lab Results   Component Value Date    HGBA1C 5.4 10/23/2021       A&P     Nina Cox is a a 72 y.o. female presenting for   Chief Complaint   Patient presents with    Mouth Lesions       Diagnoses and all orders for this visit:    HSV-1 infection  Sore appears to be HSV-1, patient does not recall being tested prior  Given lesions are almost completely crusted over, cannot obtain swab  Recommend using Valtrex with onset of next episode and obtaining blood test  If negative blood test and/or Valtrex does not help with sx patient to f/u with Derm   -     HSV 1/2 IgG Type-specific Ab; Future  -     valACYclovir 1000 mg tablet; For oral herpes recurrent outbreaks: Take Valtrex: 2 grams twice daily x 1 day.  -  Derm referral if not improved    Cutaneous sensitivity  Requesting medication to use prior to lab draws  -     lidocaine 5% ointment; Apply topically once for 1 dose.          ICD-10-CM    1. HSV-1 infection  B00.9 HSV 1/2 IgG Type-specific Ab     valACYclovir 1000 mg tablet      2. Cutaneous sensitivity  L23.9 lidocaine 5% ointment      3. Mouth sore  K13.79 Referral to Dermatology            Return if symptoms worsen or fail to improve.    Future Appointments   Date Time Provider Department Center   09/10/2022  2:40 PM E[facility]01 MAM [facility] [place]   10/15/2022  1:15 PM Traci Sermon., MD CARD [facility]       Health Maintenance   Topic Date Due    Shingles (Shingrix) Vaccine (1 of 2) Never done    Advance Directive  Never done    Pneumococcal Vaccine (1 of 1 - PCV) Never done    COVID-19 Vaccine(Tracks primary and booster doses, not sup/immunocomp) (3 - 2023-24 season) 09/06/2021    Breast Cancer Screening: Mammogram  12/06/2021    Influenza Vaccine (1) 09/07/2022    Colorectal Cancer Screening  10/29/2022    Preventive Wellness Visit  10/30/2022    Tdap/Td Vaccine (2 - Td or Tdap) 06/11/2027    Hepatitis B Screening  Completed    Osteoporosis Early Detection DEXA Scan  Completed    Hepatitis C Screening  Completed    Statin prescribed for ASCVD Prevention or Treatment  Completed         The above plan of care, diagnosis, order, and follow-up were discussed with the patient. Questions related to this recommended plan of care were answered.    Author:  Eliberto Ivory, MD, MS 08/08/2022 4:58 PM

## 2022-09-10 ENCOUNTER — Inpatient Hospital Stay: Payer: PRIVATE HEALTH INSURANCE

## 2022-10-14 DIAGNOSIS — I2510 Atherosclerotic heart disease of native coronary artery without angina pectoris: Secondary | ICD-10-CM

## 2022-10-14 DIAGNOSIS — E785 Hyperlipidemia, unspecified: Secondary | ICD-10-CM

## 2022-10-14 DIAGNOSIS — I351 Nonrheumatic aortic (valve) insufficiency: Secondary | ICD-10-CM

## 2022-10-15 ENCOUNTER — Ambulatory Visit: Payer: PRIVATE HEALTH INSURANCE | Attending: Cardiovascular Disease

## 2022-10-15 NOTE — Progress Notes
PRIMARY CARE PHYSICIAN: Eliberto Ivory., MD, MS    OUTPATIENT CARDIOLOGY FOLLOW UP PROGRESS NOTE    Subjective: Nina Cox is a pleasant 72 year old female with history of coronary artery disease, uncontrolled hypertension and dyslipidemia presents to Cardiology Clinic for follow up visit.  Since her last visit she has been stable cardiac standpoint brought her blood pressure continues to be elevated.  She has been on losartan 50 mg and has been taking the medication for the past few days. She is very active physically and sometimes she had chest discomfort in the middle of her chest.  These episodes are not necessarily exertional and lasts for a few minutes.  As a result we got a coronary CT angiogram which showed nonobstructive CAD detailed below.   I also put her on atorvastatin and aspirin but she has not not been taking her atorvastatin regularly. She denies having any shortness of breath, palpitations, orthopnea, presyncope or syncope.     Review of Systems:  Gen: no fevers, chills, or night sweats.  HEENT: no acute visual or hearing changes; no new oral lesions or excess secretions.  CV: no chest pain, orthopnea, PND, palpitations, or syncope.  Resp: no SOB, DOE, cough, or hemoptysis.  GI: no diarrhea, constipation, hematochezia, or melena.  GU: no dysuria, urgency, or hematuria.  Musc: no acute joint swelling or pain.  Skin: no acute rashes.  Neuro: no acute numbness, weakness, or seizures.  Psych: no anxiety or depression.  Endocrine: no heat or cold intolerance.  Heme/Lymph: no spontaneous bruising, bleeding, or lymph node swelling.  Allergic/Immunologic: no acute allergic reactions or hives.    Past Medical History:   Diagnosis Date    Glaucoma     Hypertension        Past Surgical History:   Procedure Laterality Date    BREAST SURGERY         Family History   Problem Relation Age of Onset    Coronary artery disease Father        Social History     Tobacco Use    Smoking status: Never    Smokeless tobacco: Never   Vaping Use    Vaping status: Never Used   Substance Use Topics    Alcohol use: Yes    Drug use: No       Allergies   Allergen Reactions    Amlodipine      Other reaction(s): Other  Edema      Outpatient Medications Prior to Visit   Medication Sig    ALBUTEROL 90 mcg/act inhaler INHALE 1 PUFF BY MOUTH EVERY 4 HOURS AS NEEDED FOR SHORTNESS OF BREATH OR WHEEZING    aspirin 81 mg EC tablet Take 1 tablet (81 mg total) by mouth daily.    atorvastatin 10 mg tablet Take 1 tablet (10 mg total) by mouth at bedtime.    losartan 50 mg tablet Take 1 tablet (50 mg total) by mouth daily.    prednisoLONE acetate 1% ophthalmic suspension          lidocaine 5% ointment APPLY TOPICALLY TO THE AFFECTED AREA 1 TIME FOR 1 DOSE (Patient not taking: Reported on 12/10/2021.)    LUMIGAN 0.01 % ophthalmic solution Place 1 drop into both eyes at bedtime. (Patient not taking: Reported on 07/15/2022.)          No facility-administered medications prior to visit.           EXAM:  LMP  (LMP Unknown)  02/07/2022     9:47 AM 02/26/2022     1:35 PM 02/26/2022     1:48 PM 03/14/2022    12:55 PM 03/20/2022     2:40 PM 07/15/2022     1:09 PM 08/08/2022     4:44 PM   Vitals - 1 value per visit   SYSTOLIC 133 168 161 172  152 096   DIASTOLIC 66 85 62 82  57 67   Heart Rate 77 63 60 61  65 67   Temp 36.6 ?C (97.9 ?F)      36.6 ?C (97.8 ?F)   Resp 16 18 18 18  17 16    Weight (lb) 121   123 123.02 123 --   Height 4' 11'' (1.499 m)    4' 11'' (1.499 m) 4' 11'' (1.499 m)    BMI 24.44 kg/m2   24.84 kg/m2 24.85 kg/m2 24.84 kg/m2    SpO2 93 %   96 %  94 % 96 %   Visit Report Report   Report  Report Report       GEN: awake and alert x3, no acute distress. Able to converse.  HEENT: PERRL, EOMI,moist conjunctivae, oral cavity clear; ears and nose atraumatic.   NECK: No significant JVD; +2 carotids bilaterally with no significant bruits.   HEART: RRR with normal S1 and S2, II/VI systolic murmur noted  LUNGS: CTAB bilaterally with no rales or wheezes. Normal respiratory effort.  ABD: soft, NT, ND, +BS.   EXT: non-tender with no edema   MUSC: no joint swelling or effusions with normal range of movement of upper and lower extremities.  SKIN: no acute rashes.    ASCVD Risk Estimation  19.0% is the estimated 10-year risk of atherosclerotic cardiovascular disease (ASCVD) as of 7:05 PM on 10/14/2022  Values used to calculate ASCVD score:  Age: 72 y.o.    Gender: Female Race: Not African American.  77 mg/dL. (measured on 07/14/2022)  161 mg/dL. (measured on 07/14/2022)  149 mm Hg. (measured on 08/08/2022)  Yes  currently not a smoker  No  Click here for the 2013 ACC/AHA Cholesterol Treatment Guideline Summary (PDF).  Click here for the 2013 ACC/AHA Cardiovascular Risk Estimator tool Office manager).    Lab Results   Component Value Date    WBC 6.93 10/23/2021    HGB 17.4 (H) 10/23/2021    HCT 49.7 (H) 10/23/2021    MCV 94.1 10/23/2021    PLT 181 10/23/2021     Lab Results   Component Value Date    CREAT 0.50 (L) 02/20/2022    BUN 20 02/20/2022    NA 141 02/20/2022    K 4.6 02/20/2022    CL 103 02/20/2022    CO2 23 02/20/2022     Lab Results   Component Value Date    HGBA1C 5.4 10/23/2021     Lab Results   Component Value Date    ALT 36 10/23/2021    AST 49 10/23/2021    ALKPHOS 41 10/23/2021    BILITOT 0.5 10/23/2021     Lab Results   Component Value Date    TSH 1.2 10/23/2021     Lab Results   Component Value Date    CALCIUM 9.9 02/20/2022     Lab Results   Component Value Date    CHOL 161 07/14/2022    CHOLHDL 77 07/14/2022    CHOLDLCAL 70 07/14/2022    TRIGLY 69 07/14/2022     Chest CT 08/24/2020:  personally reviewed and interpreted, calcified plaques seen in the aorta and left main/ostial LAD      ECG 10/22/2021:  personally reviewed and interpreted. NSR, VR 69 bpm, PR interval 176 ms, QRS duration 80 ms, QTc 441 ms.      Coronary CTA 02/26/2022: personally reviewed and interpreted.      IMPRESSION: Good-quality CT coronary angiogram with no non evaluable segments.  Calcified plaque at the proximal LAD resulting in mild (under 50%) stenosis. Otherwise, the coronary artery anatomy is conventional without high-grade stenosis.  Peripherally calcified bilateral breast prostheses. Unchanged ovoid soft tissue nodule posterior the left silicone implant with internal calcifications. While this finding is unchanged from 2022 prior, consider follow-up with dedicated breast imaging for further evaluation.     Echocardiogram 03/14/2022: personally reviewed and interpreted.    CONCLUSIONS   1. Normal left ventricular size, wall thickness and function (EF 70%).   2. Normal right ventricular size and systolic function.   3. Mild aortic regurgitation.   4. There are no prior studies on this patient for comparison purposes.    ECG today 10/15/2022: Performed in clinic and personally reviewed and interpreted. NSR, VR bpm, PR interval ms, QRS duration ms, QTc ms.      ASSESSMENT: Nina Cox is a pleasant 72 year old female with history of coronary artery disease, uncontrolled hypertension and dyslipidemia presents to Cardiology Clinic for follow up visit.      PLAN:  #Coronary artery disease:  Seen on chest CT, nonobstructive  -continue aspirin 81 mg after her procedure  - agreed to take atorvastatin 10 mg q.h.s regularly. the importance of taking the medications discussed with the patient in detail    #Hypertension:  Not well controlled, I rechecked her blood pressure after a few minutes of resting and was 173/81 mmHg, did not tolerate amlodipine causing leg swelling  - continue losartan 50 mg regularly  - will add hydrochlorothiazide 12.5 mg to her regimen.  - home blood pressure monitoring instructed  - she will notify me if her blood pressure is elevated at home    #Dyslipidemia: LDL 79 -> 70  - agreed to take atorvastatin 10 mg q.h.s. regularly, a refill was sent to her pharmacy  - will continue to monitor    #Aortic Regurgitation:  New diagnosis.  Discussed with the patient in detail  - will make sure her blood pressure is well controlled and will monitor periodically    #Follow up: 3 months from now    Thank you for the opportunity to continue to participate in your patient's care.    Antony Haste, MD  [facility] [employer]  Dept. of Medicine, Div. of Cardiology  [facility]  [address],   [place], [place] [address]

## 2022-11-05 MED ORDER — ASPIRIN LOW DOSE 81 MG PO TBEC
81 mg | ORAL_TABLET | Freq: Every day | ORAL | 3 refills | 30.00000 days
Start: 2022-11-05 — End: ?

## 2022-11-06 MED ORDER — ASPIRIN LOW DOSE 81 MG PO TBEC
81 mg | ORAL_TABLET | Freq: Every day | ORAL | 2 refills | Status: AC
Start: 2022-11-06 — End: ?

## 2022-11-24 ENCOUNTER — Ambulatory Visit: Payer: PRIVATE HEALTH INSURANCE | Attending: Student in an Organized Health Care Education/Training Program

## 2022-11-25 ENCOUNTER — Ambulatory Visit: Payer: PRIVATE HEALTH INSURANCE | Attending: Student in an Organized Health Care Education/Training Program

## 2022-11-25 DIAGNOSIS — B009 Herpesviral infection, unspecified: Secondary | ICD-10-CM

## 2022-11-25 DIAGNOSIS — G8929 Other chronic pain: Secondary | ICD-10-CM

## 2022-11-25 DIAGNOSIS — M25561 Pain in right knee: Secondary | ICD-10-CM

## 2022-11-26 ENCOUNTER — Ambulatory Visit: Payer: PRIVATE HEALTH INSURANCE

## 2022-11-26 ENCOUNTER — Ambulatory Visit: Payer: PRIVATE HEALTH INSURANCE | Attending: Cardiovascular Disease

## 2022-11-26 ENCOUNTER — Telehealth: Payer: PRIVATE HEALTH INSURANCE

## 2022-11-26 DIAGNOSIS — I2510 Atherosclerotic heart disease of native coronary artery without angina pectoris: Secondary | ICD-10-CM

## 2022-11-26 MED ORDER — ACETAMINOPHEN ER 650 MG PO TBCR
650 mg | ORAL_TABLET | Freq: Three times a day (TID) | ORAL | 0 refills | Status: AC | PRN
Start: 2022-11-26 — End: ?

## 2022-11-26 MED ORDER — VALACYCLOVIR HCL 1 G PO TABS
ORAL_TABLET | 0 refills | Status: AC
Start: 2022-11-26 — End: ?

## 2022-11-26 NOTE — Patient Instructions
Exercise Program for Meniscus Tear   Your healthcare provider may recommend exercises to help treat your meniscus tear.   Talk to your healthcare provider or physical therapist about which exercises are best for you and your rehabilitation goals.   Start each exercise slowly. A little discomfort is normal but stop any exercise that causes pain.   Quad Sets  Sit on the floor with your affected leg straight. Your other leg can be straight or bent.  Flex the foot of your affected leg by bringing your toes toward you. Press the back of your knee into the floor while tightening the muscle on the top of your thigh.  Hold for 5 seconds. Then relax.  Repeat 10 times.  Tip:  If you feel discomfort in the front or back of your knee, you can place a small rolled towel under your knee.    Straight Leg Raise  Lie on your back with your affected leg straight in front of you. Bend your unaffected knee up so your foot is flat on the floor.  Flex the foot of your straight leg by bringing your toes toward you. Then tighten the thigh muscles of your straight leg by pressing the back of your knee into the floor.  Raise your straight leg about 8 inches off the floor, about the height of your opposite knee. Hold for 5 seconds, then lower slowly.  Repeat 10 times.  Tip:  Do not arch your back or hunch your shoulders during the exercise.    Mini Squat  Stand with your feet shoulder-width apart and your hands on the back of a chair, table, or countertop for balance.  Bend your knees into a mini squat, stopping about one-third of the way down. Then slowly stand back up.  Repeat 10 times.  Tips:  Keep your back straight and lean your upper body forward slightly.  Keep your heels on the floor at all times.  Do not let your knees go forward past your toes.    Hamstring Curls  Lie on your stomach with your knees straight. You may place a pillow under your pelvis for comfort, if desired.  Lift the foot of your affected leg up towards your buttocks. Then slowly lower your leg back to the floor.  Repeat 10 times.  Tip:  If it hurts to lift your leg, try bending your knee less.    Clamshell  Lie on your side and bend both knees.  Keeping your feet together, lift your top knee up so your knees are separated. Keep your hips stacked on top of each other.  Slowly lower your knee back down.  Repeat 10 times. Then switch sides.    [employer] last reviewed this educational content on 12/06/2021  ? 2000-2023 [employer], [place]. All rights reserved. This information is not intended as a substitute for professional medical care. Always follow your healthcare professional's instructions.        Reducing Knee Pain and Swelling  Many treatments can help reduce pain and swelling in your knee. Your healthcare provider may suggest one or more of the following treatments:   Icing your knee. This helps reduce swelling. You may be asked to ice your knee once a day or more. Apply ice for about 15 to 20 minutes at a time, with at least 40 minutes between sessions. To make an ice pack, put ice cubes in a plastic bag that seals at the top. Wrap the bag in a clean, thin  towel or cloth. Never put ice or an ice pack directly on your skin.  Keeping your leg raised up higher than your heart. This helps excess fluid flow out of your knee joint to reduce swelling.  Compression. This means wrapping an elastic bandage or neoprene sleeve snugly around your knees. It keeps fluid from collecting in and around your knee joint.    Electrical stimulation. This is done by a physical therapist or athletic trainer. It can help reduce excess fluid in your knee joint.  Anti-inflammatory medicines. These may be prescribed by your healthcare provider. You may take pills or get shots (injections) in your knee.  Isometric (contracting) exercises. These strengthen the muscles that support your knee joint. They also help reduce excess fluid in your knee.  [employer] last reviewed this educational content on 12/07/2019  ? 2000-2023 [employer], [place]. All rights reserved. This information is not intended as a substitute for professional medical care. Always follow your healthcare professional's instructions.

## 2022-11-26 NOTE — Telephone Encounter
Call Back Request      Reason for call back: Patient has appt on 11/22 with Dr. Soyla Dryer, she wants to know if blood work will be needed. Please follow up with Patient.     Any Symptoms:  []  Yes  [x]  No      If yes, what symptoms are you experiencing:    Duration of symptoms (how long):    Have you taken medication for symptoms (OTC or Rx):      If call was taken outside of clinic hours:    [] Patient or caller has been notified that this message was sent outside of normal clinic hours.     [] Patient or caller has been warm transferred to the physician's answering service. If applicable, patient or caller informed to please call us back if symptoms progress.  Patient or caller has been notified of the turnaround time of 1-2 business day(s).

## 2022-11-26 NOTE — Progress Notes
PATIENT: Nina Cox    MRN: [id number]    DOB: 12/28/1950    DATE OF SERVICE: 11/25/2022    Chief Complaint:   Chief Complaint   Patient presents with    Leg Pain     Right leg pain 2 weeks ago . Feeling better today .   Right knee pain        HPI   Nina Cox is a 72 y.o. female here for   Chief Complaint   Patient presents with    Leg Pain     Right leg pain 2 weeks ago . Feeling better today .   Right knee pain        #Right leg/knee pain  -onset: around 08/2022  -was walking up stairs when she suddenly felt pain deep inside her in her right knee joint  -thought the pain would go away on its own, however had state constant for the past few months  -denies any prior issues with her knees before this event  -denies any catching, locking, or buckling of her knee joint, however does feel unsteady/unstable on her knee   -has not done much for the pain, does take aspirin 81 mg daily per Cardiology recommendations (denies history of heart attack or stroke), thus not taking NSAIDs; has not taken any Tylenol, ice/heat therapy  -has not had prior imaging of her knee done  -of note, around 2 weeks ago had experienced widespread pruritus without rash, then severe ?Charley horse?/cramp on her right thigh and pain that went up her leg in which she had presented to urgent care and was recommended to go to the emergency department, however declined to go, in the next day symptoms resolved; denies any recurrence of symptoms since then, however prompted her to come to evaluate her right knee pain today  -thought she might need stem cell injections as her friends her age have done so  -works as a divorce attorney    #HSV/cold sore  -was prescribed Valtrex for cold sores by her PCP, however reported that her pharmacy and was unable to receive prescription?    PMH/ALL/MEDS/FH/SH/IMMUNIZATION        Past Medical History:   Diagnosis Date    Glaucoma     Hypertension      Past Surgical History:   Procedure Laterality Date BREAST SURGERY       Patient Active Problem List    Diagnosis Date Noted    Aortic regurgitation 03/14/2022    Noncompliance with medication regimen 03/14/2022    Atherosclerosis of native coronary artery of native heart 10/30/2021    Hardening of the aorta (main artery of the heart) (HCC/RAF) 10/21/2019    Primary open angle glaucoma (POAG) of both eyes 04/15/2019    Abnormal tomography of chest 01/20/2018    Nevus of choroid 08/22/2016    Insufficiency, tear film 08/22/2016    Hyperlipidemia 10/31/2007    Prediabetes 10/31/2007    Essential hypertension 06/21/2004    Chronic nonalcoholic liver disease 06/21/2004     Overview:   Saw dr. Serafina Royals in 2006.  Negative hep b, hep c, hemochromatosis, autoimmune, ceruloplasmin labs.  [place] 9/06 negative       Allergies   Allergen Reactions    Amlodipine      Other reaction(s): Other  Edema      Current Outpatient Medications   Medication Sig    aspirin (ASPIRIN LOW DOSE) 81 mg EC tablet TAKE 1 TABLET(81 MG) BY MOUTH DAILY  atorvastatin 10 mg tablet Take 1 tablet (10 mg total) by mouth at bedtime.    doxycycline hyclate 20 mg tablet Take 1 tablet (20 mg total) by mouth two (2) times daily.    hydroCHLOROthiazide 12.5 MG tablet Take 1 tablet (12.5 mg total) by mouth daily.    losartan 50 mg tablet Take 1 tablet (50 mg total) by mouth daily.    MIEBO 1.338 GM/ML SOLN INSTILL ONE DROP INTO EACH EYE FOUR TIMES DAILY    prednisoLONE acetate 1% ophthalmic suspension     ALBUTEROL 90 mcg/act inhaler INHALE 1 PUFF BY MOUTH EVERY 4 HOURS AS NEEDED FOR SHORTNESS OF BREATH OR WHEEZING (Patient not taking: Reported on 11/25/2022)    valACYclovir 1000 mg tablet For oral herpes recurrent outbreaks: Take Valtrex: 2 grams twice daily x 1 day. (Patient not taking: Reported on 11/25/2022)     No current facility-administered medications for this visit.     Medications: reviewed medication list in the chart  Medications that the patient states to be currently taking   Medication Sig    aspirin (ASPIRIN LOW DOSE) 81 mg EC tablet TAKE 1 TABLET(81 MG) BY MOUTH DAILY    atorvastatin 10 mg tablet Take 1 tablet (10 mg total) by mouth at bedtime.    doxycycline hyclate 20 mg tablet Take 1 tablet (20 mg total) by mouth two (2) times daily.    hydroCHLOROthiazide 12.5 MG tablet Take 1 tablet (12.5 mg total) by mouth daily.    losartan 50 mg tablet Take 1 tablet (50 mg total) by mouth daily.    MIEBO 1.338 GM/ML SOLN INSTILL ONE DROP INTO EACH EYE FOUR TIMES DAILY    prednisoLONE acetate 1% ophthalmic suspension        Family History   Problem Relation Age of Onset    Coronary artery disease Father      Immunization History   Administered Date(s) Administered    COVID-19, mRNA, (Pfizer - Purple Cap) 30 mcg/0.3 mL 05/07/2019, 05/28/2019    Hepatitis B, unspecified formulation 07/05/2004    Td 04/14/2007    Tdap 06/10/2017       Social history  Social History     Tobacco Use   Smoking Status Never   Smokeless Tobacco Never     Social History     Substance and Sexual Activity   Alcohol Use Yes     Social History     Substance and Sexual Activity   Drug Use No     Social History     Socioeconomic History    Marital status: Divorced   Tobacco Use    Smoking status: Never    Smokeless tobacco: Never   Vaping Use    Vaping status: Never Used   Substance and Sexual Activity    Alcohol use: Yes    Drug use: No    Sexual activity: Not Currently   Other Topics Concern    1. Need Help Feeding Yourself? No    2. Need Help Getting Dressed? No    3. Need Help Using the Telephone? No    4. 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 Need Help Shopping for Groceries? No    6. Need Help Getting Places Beyond Walking Distance? (Bus, Taxi) No    7. Need Help Getting from Bed to Chair? No    8. Need Help Bathing or Showering? No    9. Need Help Taking your Medications? No  10.  Need Help Doing Moderately Strenuous Housework? (ex. Laundry) No    11. Need Help Driving? No    12. Need Help Getting to the Toilet? No    13. Need Help Walking Across the Road? (Includes [place], Walker) No    14. Need Help Preparing Meals? No    15. Need Help Shopping for Personal Items? (Toiletries, Medicines) No    16. Need Help Climbing a Flight of Stairs? No    17. Do you live with someone who assists you at home? No    18. Do you get help from family members or friends in your home? No    19. Do you employ someone to provide health related care or help you in your home? No    20. Do you provide care for a family member? No    21. Does your home have rugs in the hallway? No    22. Does your home have poor lighting? No    23. Does your home lack grab bars in the bathroom? No    24. Does your home lack handrails on the stairs? Yes    25. Have you noticed any hearing difficulties? No    26. Do you currently participate in any regular activity to improve or maintain your physical fitness? Yes    27. Do you always wear a seatbelt when you ride in a car? Yes    28. If you drink alcohol, do you drink more than 7 drinks per week or more than 3 drinks on any given day? No    29. Has anyone ever been concerned about your drinking? No    Do you exercise at least a day, 3 or more days a week? Yes    Types of Exercise? (List in Comments) No    Do you follow a special diet? No    Vegan? No    Vegetarian? No    Pescatarian? No    Lactose Free? No    Gluten Free? No    Omnivore? No     Social History     Social History Narrative    Not on file       All information above was reivewed with the patient and/or caregiver.    REVIEW OF SYSTEMS   14 point ROS performed and negative except per HPI noted above    PHYSICAL EXAM     Vitals:    11/25/22 1639   BP: 147/85   Pulse: 79   Resp: 17   Temp: 36.4 ?C (97.5 ?F)   TempSrc: Oral   SpO2: 98%   Weight: 124 lb (56.2 kg)   Height: 5' (1.524 m)       General appearance: well appearing, no acute distress  Eyes: pupils equal and reactive, extraocular eye movements intact, conjunctivae not injected  Nose: patent, no discharge   Mouth: mucous membranes moist  Respiratory: breathing comfortably on room air, no accessory muscle use  Cardiovascular: normal rate, no peripheral edema  Gastrointestinal: soft, non-tender, non-distended  Musculoskeletal: moving all extremities  Right Knee exam:  INSPECTION: no overlying skin changes.   PALPATION:  No tenderness medial joint line  No tenderness lateral joint line  No tenderness MCL  No tenderness over anterior patella  No tenderness patella tendon  No tenderness patellar facets  No tenderness proximal fibular head  No tenderness posterior knee  No tenderness ITB/lateral femoral condyle  No tenderness posterior calf  ROM:  Full ROM flexion  Full ROM extension  STABILITY TESTING:  Neg posterior drawer  Neg lachman  Neg varus stress at zero and 30 degrees  Neg valgus stress at zero and 30 degrees  SPECIAL TESTS:  + patellar grind  Neg Obers/Noble  Neg Mcmurray's  + pain elicited upon duck walk in medial compartment of right knee  Skin: warm, dry, normal coloration, no rashes on exposed skin  Neurological: alert, oriented to person, place, time, and situation, normal speech  Psychiatric: cooperative, pleasant     LABS/IMAGING     Trends  Weight:   Wt Readings from Last 3 Encounters:   11/25/22 124 lb (56.2 kg)   07/15/22 123 lb (55.8 kg)   03/20/22 123 lb 0.3 oz (55.8 kg)     BP readings:   BP Readings from Last 3 Encounters:   11/25/22 147/85   08/08/22 149/67   07/15/22 152/57       HbA1c  HbA1c:   Hgb A1c   Date Value Ref Range Status   10/23/2021 5.4 <5.7 % Final     Comment:     For patients with diabetes, an A1c less than (<) or equal (=) to 7.0% is recommended for most patients, however the goal may be higher or lower depending on age and/or other medical problems.   For a diagnosis of diabetes, A1c greater than (>) or equal(=) to 6.5% indicates diabetes; values between 5.7% and 6.4% may indicate an increased risk of developing diabetes.        Lipid panel (Total cholesterol, LDL, HDL, triglycerides)  Lab Results   Component Value Date    CHOL 161 07/14/2022    CHOLHDL 77 07/14/2022    CHOLDLCAL 70 07/14/2022    TRIGLY 69 07/14/2022       Chemistry panel  Lab Results   Component Value Date    NA 141 02/20/2022    K 4.6 02/20/2022    CL 103 02/20/2022    CO2 23 02/20/2022    BUN 20 02/20/2022    CREAT 0.50 (L) 02/20/2022    GLUCOSE 102 (H) 02/20/2022    CALCIUM 9.9 02/20/2022    ALT 36 10/23/2021    AST 49 10/23/2021    BILITOT 0.5 10/23/2021    ALKPHOS 41 10/23/2021    ALBUMIN 4.7 10/23/2021     No results found for: ''GFRESTNOAA'']  No results found for: ''GFRESTAA'']  Lab Results   Component Value Date    HGBA1C 5.4 10/23/2021     Lab Results   Component Value Date    ALT 36 10/23/2021    AST 49 10/23/2021    ALKPHOS 41 10/23/2021    BILITOT 0.5 10/23/2021     Lab Results   Component Value Date    CALCIUM 9.9 02/20/2022     Lab Results   Component Value Date    ALKPHOS 41 10/23/2021     No results found for: ''URICACID''    Complete blood count  Lab Results   Component Value Date    WBC 6.93 10/23/2021    HGB 17.4 (H) 10/23/2021    HCT 49.7 (H) 10/23/2021    MCV 94.1 10/23/2021    PLT 181 10/23/2021       Thyroid function  Lab Results   Component Value Date    TSH 1.2 10/23/2021       Data/Studies independently Reviewed by me    ASSESSMENT & PLAN   72 y.o. female with   Chief Complaint   Patient  presents with    Leg Pain     Right leg pain 2 weeks ago . Feeling better today .   Right knee pain        1. Chronic pain of right knee  Chronic; ongoing  History and physical are concerning for possible shearing mechanical stress caused from going upstairs that may have led to meniscal injury and reproducible pain with loading of joint (via duck walk), differential also includes osteoarthritis, unlikely crystal arthropathy; does warrant imaging evaluation of such and may benefit from specialist's evaluation as well including with possible joint injection given limitation with concurrent use of aspirin and NSAID  -order knee x-rays as below  -ordered Tylenol Arthritis 650 mg as needed as below  -referred to orthopedic surgery as below  - XR knee ap+lat+sunrise right (3 views); Future  - acetaminophen 650 mg CR tablet; Take 1 tablet (650 mg total) by mouth every eight (8) hours as needed for Pain.  Dispense: 30 tablet; Refill: 0  - Referral to Orthopaedic Surgery, [facility]    2. HSV-1 infection  History of such, likely leading to cold sores   -re-prescribed Valtrex as below  - valACYclovir 1000 mg tablet; For oral herpes recurrent outbreaks: Take Valtrex: 2 grams twice daily x 1 day  Dispense: 30 tablet; Refill: 0    Return for with specialist. or sooner PRN  Future Appointments   Date Time Provider Department Center   11/28/2022  1:45 PM Traci Sermon., MD CARD [facility]   12/03/2022  2:40 PM [facility]ud[facility] Buckler       The above plan of care, diagnosis, orders, and follow-up were discussed with the patient.  Questions related to this recommended plan of care were answered.    Signed,  Arion Shankles T. Rutherford Nail, MD   Health Sciences Assistant Professor  Department of Medicine  [facility] [employer] [place], [place]

## 2022-11-27 NOTE — Telephone Encounter
Called patient back to inform her that Dr Soyla Dryer placed lab orders in the system.   Pt can have blood drawn at any [facility] lab drawing location.   Please note lab is closed in [place] on 11/27/22 and will reopen on 11/28/22.  Unable to leave message for pt, phone just rang with no vm option.   If pt calls back please relay message.   Any questions contact Roseana Rhine ext [phone] for further assistance.

## 2022-11-28 ENCOUNTER — Ambulatory Visit: Payer: PRIVATE HEALTH INSURANCE | Attending: Cardiovascular Disease

## 2022-11-28 DIAGNOSIS — K529 Noninfective gastroenteritis and colitis, unspecified: Secondary | ICD-10-CM

## 2022-11-28 DIAGNOSIS — R053 Chronic cough: Secondary | ICD-10-CM

## 2022-11-29 MED ORDER — HYDROCHLOROTHIAZIDE 25 MG PO TABS
25 mg | ORAL_TABLET | Freq: Every day | ORAL | 3 refills | Status: AC
Start: 2022-11-29 — End: ?

## 2022-11-29 MED ORDER — BLOOD PRESSURE KIT
1 [IU] | Freq: Once | 0 refills | Status: AC
Start: 2022-11-29 — End: ?

## 2022-11-29 NOTE — Progress Notes
PRIMARY CARE PHYSICIAN: Eliberto Ivory., MD, MS    OUTPATIENT CARDIOLOGY FOLLOW UP PROGRESS NOTE    Subjective: Yunalesca Muessig is a pleasant 72 year old female with history of coronary artery disease, uncontrolled hypertension and dyslipidemia presents to Cardiology Clinic for follow up visit.  During the last visit I put her on hydrochlorothiazide 12.5 mg daily.  Her blood pressure is better controlled but still not at goal.  She also takes losartan 50 mg daily as well.  Today she complains of chronic cough and chronic diarrhea for the past 3 weeks  She is very active physically and sometimes she had chest discomfort in the middle of her chest.  These episodes are not necessarily exertional and lasts for a few minutes.  As a result we got a coronary CT angiogram which showed nonobstructive CAD detailed below.   I also put her on atorvastatin and aspirin but she has not not been taking her atorvastatin regularly. She denies having any shortness of breath, palpitations, orthopnea, presyncope or syncope.     Review of Systems:  Gen: no fevers, chills, or night sweats.  HEENT: no acute visual or hearing changes; no new oral lesions or excess secretions.  Positive for chronic cough  CV: no chest pain, orthopnea, PND, palpitations, or syncope.  Resp: no SOB, DOE, cough, or hemoptysis.  GI: no constipation, hematochezia, or melena.  Positive for chronic diarrhea  GU: no dysuria, urgency, or hematuria.  Musc: no acute joint swelling or pain.  Skin: no acute rashes.  Neuro: no acute numbness, weakness, or seizures.  Psych: no anxiety or depression.  Endocrine: no heat or cold intolerance.  Heme/Lymph: no spontaneous bruising, bleeding, or lymph node swelling.  Allergic/Immunologic: no acute allergic reactions or hives.    Past Medical History:   Diagnosis Date    Glaucoma     Hypertension        Past Surgical History:   Procedure Laterality Date    BREAST SURGERY         Family History   Problem Relation Age of Onset Coronary artery disease Father        Social History     Tobacco Use    Smoking status: Never    Smokeless tobacco: Never   Vaping Use    Vaping status: Never Used   Substance Use Topics    Alcohol use: Yes    Drug use: No       Allergies   Allergen Reactions    Amlodipine      Other reaction(s): Other  Edema      Outpatient Medications Prior to Visit   Medication Sig    acetaminophen 650 mg CR tablet Take 1 tablet (650 mg total) by mouth every eight (8) hours as needed for Pain.    ALBUTEROL 90 mcg/act inhaler INHALE 1 PUFF BY MOUTH EVERY 4 HOURS AS NEEDED FOR SHORTNESS OF BREATH OR WHEEZING    aspirin (ASPIRIN LOW DOSE) 81 mg EC tablet TAKE 1 TABLET(81 MG) BY MOUTH DAILY    atorvastatin 10 mg tablet Take 1 tablet (10 mg total) by mouth at bedtime.    doxycycline hyclate 20 mg tablet Take 1 tablet (20 mg total) by mouth two (2) times daily.    hydroCHLOROthiazide 12.5 MG tablet Take 1 tablet (12.5 mg total) by mouth daily.    losartan 50 mg tablet Take 1 tablet (50 mg total) by mouth daily.    MIEBO 1.338 GM/ML SOLN INSTILL ONE DROP INTO  EACH EYE FOUR TIMES DAILY    prednisoLONE acetate 1% ophthalmic suspension     valACYclovir 1000 mg tablet For oral herpes recurrent outbreaks: Take Valtrex: 2 grams twice daily x 1 day     No facility-administered medications prior to visit.       EXAM:  BP 159/82  ~ Pulse 83  ~ Resp 15  ~ LMP  (LMP Unknown)  ~ SpO2 96%       02/26/2022     1:48 PM 03/14/2022    12:55 PM 03/20/2022     2:40 PM 07/15/2022     1:09 PM 08/08/2022     4:44 PM 11/25/2022     4:39 PM 11/28/2022     4:32 PM   Vitals - 1 value per visit   SYSTOLIC 130 172  152 149 147 413   DIASTOLIC 62 82  57 67 85 82   Heart Rate 60 61  65 67 79 83   Temp     36.6 ?C (97.8 ?F) 36.4 ?C (97.5 ?F)    Resp 18 18  17 16 17 15    Weight (lb)  123 123.02 123 -- 124 --   Height   4' 11'' (1.499 m) 4' 11'' (1.499 m)  5' (1.524 m)    BMI  24.84 kg/m2 24.85 kg/m2 24.84 kg/m2  24.22 kg/m2    SpO2  96 %  94 % 96 % 98 % 96 %   Visit Report Report  Report Report Report Report       GEN: awake and alert x3, no acute distress. Able to converse.  HEENT: PERRL, EOMI,moist conjunctivae, oral cavity clear; ears and nose atraumatic.   NECK: No significant JVD; +2 carotids bilaterally with no significant bruits.   HEART: RRR with normal S1 and S2, II/VI systolic murmur noted  LUNGS: CTAB bilaterally with no rales or wheezes. Normal respiratory effort.  ABD: soft, NT, ND, +BS.   EXT: non-tender with no edema   MUSC: no joint swelling or effusions with normal range of movement of upper and lower extremities.  SKIN: no acute rashes.    ASCVD Risk Estimation  21.3% is the estimated 10-year risk of atherosclerotic cardiovascular disease (ASCVD) as of 4:45 PM on 11/28/2022  Values used to calculate ASCVD score:  Age: 72 y.o.    Gender: Female Race: Not African American.  64 mg/dL. (measured on 11/27/2022)  147 mg/dL. (measured on 11/27/2022)  159 mm Hg. (measured on 11/28/2022)  Yes  currently not a smoker  No  Click here for the 2013 ACC/AHA Cholesterol Treatment Guideline Summary (PDF).  Click here for the 2013 ACC/AHA Cardiovascular Risk Estimator tool Office manager).    Lab Results   Component Value Date    WBC 6.93 10/23/2021    HGB 17.4 (H) 10/23/2021    HCT 49.7 (H) 10/23/2021    MCV 94.1 10/23/2021    PLT 181 10/23/2021     Lab Results   Component Value Date    CREAT 0.60 11/27/2022    BUN 13 11/27/2022    NA 134 (L) 11/27/2022    K 4.1 11/27/2022    CL 98 11/27/2022    CO2 22 11/27/2022     Lab Results   Component Value Date    HGBA1C 5.4 10/23/2021     Lab Results   Component Value Date    ALT 30 11/27/2022    AST 44 11/27/2022    ALKPHOS 57 11/27/2022    BILITOT  0.5 11/27/2022     Lab Results   Component Value Date    TSH 1.2 10/23/2021     Lab Results   Component Value Date    CALCIUM 9.6 11/27/2022     Lab Results   Component Value Date    CHOL 147 11/27/2022    CHOLHDL 64 11/27/2022    CHOLDLCAL 66 11/27/2022    TRIGLY 84 11/27/2022     Chest CT 08/24/2020: personally reviewed and interpreted, calcified plaques seen in the aorta and left main/ostial LAD      ECG 10/22/2021:  personally reviewed and interpreted. NSR, VR 69 bpm, PR interval 176 ms, QRS duration 80 ms, QTc 441 ms.      Coronary CTA 02/26/2022: personally reviewed and interpreted.      IMPRESSION: Good-quality CT coronary angiogram with no non evaluable segments.  Calcified plaque at the proximal LAD resulting in mild (under 50%) stenosis. Otherwise, the coronary artery anatomy is conventional without high-grade stenosis.  Peripherally calcified bilateral breast prostheses. Unchanged ovoid soft tissue nodule posterior the left silicone implant with internal calcifications. While this finding is unchanged from 2022 prior, consider follow-up with dedicated breast imaging for further evaluation.     Echocardiogram 03/14/2022: personally reviewed and interpreted.    CONCLUSIONS   1. Normal left ventricular size, wall thickness and function (EF 70%).   2. Normal right ventricular size and systolic function.   3. Mild aortic regurgitation.   4. There are no prior studies on this patient for comparison purposes.    ECG 10/15/2022: Ppersonally reviewed and interpreted. NSR, VR bpm, PR interval ms, QRS duration ms, QTc ms.      ASSESSMENT: Kyanah Felan is a pleasant 72 year old female with history of coronary artery disease, uncontrolled hypertension and dyslipidemia presents to Cardiology Clinic for follow up visit.      PLAN:  #Hypertension:  Not well controlled, I rechecked her blood pressure after a few minutes of resting and was still elevated.  - continue losartan 50 mg regularly  - will increase her hydrochlorothiazide 12.5 mg to 25 mg daily  - repeat her basic metabolic panel in a few weeks to monitor her kidney function and electrolytes  - home blood pressure monitoring instructed  - she will notify me if her blood pressure is elevated at home    #Coronary artery disease:  Seen on chest CT, nonobstructive  - continue aspirin 81 mg after her procedure  - continue atorvastatin 10 mg q.h.s regularly. the importance of taking the medications discussed with the patient in detail    #Dyslipidemia: LDL 79 -> 70 -> 66  - agreed to take atorvastatin 10 mg q.h.s. regularly, a refill was sent to her pharmacy  - will continue to monitor    #Aortic Regurgitation:  New diagnosis.  Discussed with the patient in detail  - will make sure her blood pressure is well controlled and will monitor periodically    #Chronic cough:  Nonproductive  - not on ACE inhibitors  - PFTs were normal in 2023  - will send her back Pulmonary for further evaluation  - she was advised to try antacid medication as a trial    #Diarrhea:  For 3 weeks  - staying hydrated recommended  - will refer her to gastroenterology further evaluation    #Follow up:  4 weeks from now    Thank you for the opportunity to continue to participate in your patient's care.    Antony Haste, MD  [employer]  Dept. of Medicine, Div. of Cardiology  [facility]  [address],   [place], [place] [address]

## 2022-12-03 ENCOUNTER — Inpatient Hospital Stay: Payer: PRIVATE HEALTH INSURANCE

## 2022-12-26 NOTE — Progress Notes
Patient Care Team:  Nina Cox., MD, MS as PCP - General  Pcp, Unassigned Generic Nina Cox as PCP - Plan Assigned     Visit type: video telehealth   Patient Consent to Telehealth   The patient agreed to participate in the video visit prior to joining the visit.            Summary   Patient is seen for preventative care and chronic condition management.     Summary of Visit     Considerations for PCP:    1. Topic - request***   2.     New Conditions Identified: discussed with patient during visit   1. Hypertensive heart disease   2.     Referrals Ordered    []  Care Coordination (Social Work)   []  Behavioral Health (BHA)   []  Audiology   []  Retinal Screen    []  Ophthalmology   []  Diabetes Educator   []  Podiatry   []  [facility]   []  [facility] at [facility])    Care Gaps Addressed  Advance Directives:    []  Not on file. Discussed treatment options and how to complete form.       Vaccinations: advised on where to access vaccine  []  COVID   []  Pneumococcal   []  Shingles     Cancer screening:    []  Colorectal: ***   []  Breast: mammogram already scheduled for 03/11/2023    Other:   []  Bone density screen: ordered this visit    Lifestyle Review     Diet   []  Low carb      []  Low fat  []  Low salt    []  Vegetarian/Vegan   []  No limitations []  Unable to specify  []  Other: []  Declined to answer    Issues with access to healthy foods:   []  Yes  []  No  []  Declined to answer    Care Coordination:   []  Ambulatory Care Resources provided  []  Assistance Declined  []  None needed  []  Counseled re: healthy diet to including fruits, vegetables, whole grains, lean proteins, and oils, as well as avoiding high sodium levels, saturated or trans fats and added sugars    Exercise (frequency/time)    []  Cardiovascular: ***   []  Weight bearing: ***   []  Stretching:  ***   []  Denies regular exercise   []  Declined to answer  []  Counseled re: need for at least 150 minutes moderate-intensity or 75 minutes of vigorous-intensity aerobic physical activity per week   []  Counseled re: need for strengthening activities at least twice per week    Social History     Tobacco Use    Smoking status: Never    Smokeless tobacco: Never   Vaping Use    Vaping status: Never Used   Substance Use Topics    Alcohol use: Yes    Drug use: No      Tobacco use  []  Current smoker   Interest in quitting: []  yes  []  no  []  Counseled re: smoking avoidance  []  Resources provided  []  Former smoker   PPD:    # years:    Year quit:   []  Denies history of smoking  []  Declined to answer    Alcohol   Days of drinking/week: {hranumber:40296}  Drinks per episode: {hranumber:40296}   []  Counseled re: alcohol use  []  Declined to answer    Stress: Do you feel stress, tension, restlessness, nervousness, anxious, or  unable to sleep because your mind is troubled all the time - these days?  []  Not at all  []  A little  []  To some extent  []  Rather much  []  Very much  []  Declined to answer    Care Coordination:   []   Ambulatory Care Resources provided  []   Assistance Declined  []   None needed    Sleep   []  Feels rested on awakening  []  Poor  []  Difficulty falling asleep  []  Difficulty staying asleep  []  Declined to answer    Care Coordination:   []   Ambulatory Care Resources provided  []   Assistance Declined  []   None needed    Social Interaction  Frequency of close friend/family interactions: {hrasocialization:39889}    Intimate Partner Violence  Any fear of partner?   []  Yes  []  No  []  N/A []  Declined to answer    Financial Resources   Any difficulty paying for the following items:     []  Food    []  Housing (rent/mortgage)    []  Medical care    []  Utility bills     []  No difficulty    []  Declined to answer     How many places have you lived in the last 12 months?  ***   []  Declined to answer     Was there a time in the last 12 months where you did not have a steady place to sleep or had to sleep in a shelter?    []  Yes    []  No []  Declined to answer     Any difficulty having transportation to appointments or getting to meetings, work, or getting things needed for daily life?    []  Yes    []  No []  Declined to answer    Care Coordination:   []  Ambulatory Care Resources provided  []  Assistance Declined  []  None needed    Time spent discussing socioeconomic drivers of health: *** mins    Sensory Screening     Vision:   Last eye exam: ***    Assistive devices:   []  Glasses []  Contacts []  No assistive device []  Declined to answer    Care Coordination:  []  Ambulatory Care Resources provided *** Provided with insurance customer service number to identify covered providers ([employer]: [phone]; [employer]: [phone])  []  Assistance declined  []  Not applicable    Dental:  Last dental exam: ***    Barriers to care:   []  Cost []  Fear []  Time []  Other:  []  N/A    Assistive devices:   []  Dentures []  Partials []  Implants []  N/A    Care Coordination:   []  Ambulatory Care Resources provided  []  Assistance Declined  []  Not applicable    Hearing:   []  Decreased  []  Denies notable deficits []  Declined to answer    Last hearing exam: Not Found in Not Found on Not Found.     Assistive devices:  []  Hearing aids []  Other:   []  N/A    Care Coordination:   []  Ambulatory Care Resources provided  []  Assistance Declined  []  Not applicable    Gait:   # falls in last 12 months: {hranumber:40296}  []  Injury sustained  []  No injury  []  Declined to answer    Perception of gait:   []  Steady []  Unsteady []  Declined to answer    Assistive devices used:    []  Wheelchair   []  Walker   []   Cane   []  Person-Assisted   []  N/A    Incontinence:   []  Urinary  []  Stool  []  Leakage  []  Denies  []  Declined to answer     Functional Screening     Assistance with day to day activities:    []  Yes, (assisted by: )  []  Denies  []  Declined to answer    Nina Cox Index of Independence in Activities of Daily Living: *** pts  Bathing: {hraadl:37114}              Dressing: {hraadl:37114}  Toileting: {hraadl:37114}              Transferring: {hraadl:37114}  Continence: {hraadl:37114}          Feeding: {hraadl:37114}    Score          Indication          6         Full function  3-5        Moderate impairment  2 or less     Severe impairment    Gender  Score Indication          Women           0 Low function, dependent  6 High function, independent  Men  0 Low function, dependent      5 High function, independent    [place] Instrumental Activities of Daily Living Scale (IADL): *** pts  Telephone use:          []  1 pt    Looks up and dials numbers  []  1 pt    Dials few well-known numbers  []  1 pt    Answers telephone but does not dial  []  0 pt    Does not use telephone at all    Shopping              []  1 pt    Independent with all shopping needs  []  0 pt    Shops for small purchases  []  0 pt    Needs to be accompanied or unable    Food preparation          []  1 pt    Plans, prepares, and serves meals  []  0 pt    Prepares adequate meals if supplied with ingredients  []  0 pt    Prepares meals but does not maintain adequate diet  []  0 pt    Dependent for meal preparation    Housekeeping          []  1 pt    Maintains house alone, occasional assistance  []  1 pt    Performs light tasks (e.g. dishwashing)  []  0 pt    Does not participate in any housekeeping tasks    Laundry              []  1 pt    Does personal laundry completely  []  1 pt    Launders small items  []  0 pt    All laundry must be done by others    Transportation          []  1 pt    Travels independently  []  1 pt    Arranges own travel but does not otherwise use public transportation  []  1 pt    Travels on public transportation when accompanied by another  []  0 pt    Travel limited to taxi or automobile with assistance of another  []  0 pt  Does not travel at all    Medication Responsibility      []  1 pt    Is responsible for taking medication correctly  []  0 pt    Takes responsibility if medication is prepared in advance in separate dosing (pill boxes)  []  0 pt    Is not capable of dispensing own medication    Finances    []  1 pt    Manages financial matters independently  []  0 pt    Manages day-to-day purchases but needs help with banking  []  0 pt    Is not capable of handling money      Gender    Score         Indication          Women   0        Low function, dependent      8        High function, independent  Men         0        Low function, dependent   5        High function, independent    Mental Health Screening    ***refresh  Memory:   Any concern re: memory:  []  Yes  []  Denies []  Declined to answer    Mini-Cog Total Score:   No data recorded    PHQ-2 Total Score:       05/15/2020 10/29/2021   PHQ-2   Little interest or pleasure in doing things  No Not at all   Feeling down, depressed, or hopeless No Not at all   PHQ-2 Score  0        PHQ-9 Total Score:        No data to display               GAD-2 Total Score:      10/29/2021   GAD-2   Feeling nervous, anxious, or on edge Not at all   Not being able to stop or control worrying Not at all   GAD-2 Score 0        GAD-7 Total Score:        No data to display               DAST-10 Total Score:       No data to display               AUDIT-C Total Score:       No data to display                  Goals of Care     GOALS OF CARE & ADVANCE CARE PLANNING NOTE  Date of Meeting: 12/26/2022   Participants included: Nina Cox and Nina Nordmann, FNP          Prognostic Awareness/Understanding of Treatment Intent:  ***   Does your patient understand the illness trajectory and treatment intent?  ''How do you feel things are going?''   Values/Goals:  *** ''Based on the understanding of your disease...''  - What is most important to you?  - What are your priorities right now?  - What are you hoping for?   - What are you most concerned about?    Examples: being at home, being mentally aware, being in control of decisions, not being a burden, achieving life goals, supporting my children, maintaining autonomy   Treatment Preferences:     *** ''  Based on your values, which treatments still appear to be worthwhile to achieve your goals''    ''What are treatments you would like to avoid if your quality of life worsens?''    Family/Social support:  ***  Seante Fard (son)      Surrogate Decision Maker:  ***        Outcome and Plan  Nina Cox has no Advance Directive or POLST in [facility] and has the capacity to identify a surrogate decision maker. {Next steps in advance care planning-:40161}        *** minutes were spent on discussion of goals of care, counseling of the patient and/or family, and coordination of care.    Medical Condition Review     Medication adherence:    Missed dose    []  Often    []  Sometimes    []  Rarely    []  Never    []  Declined to answer     Causes of missed doses    []  Adverse effect of medication    []  Cost of medication    []  Forgetting    []  Other     []  N/A    Hypertensive Heart Disease (Coronary Artery Disease {WITH/WITHOUT:3005700::''with''} Angina) ***   []  New  []  Improved      []  Worse     []  Stable      []   Not Controlled    DATA REVIEW:   HTN, CAD noted by Dr. Melissa Montane on 11/28/2022.    Relevant labs, screening tests:   BP Readings from Last 3 Encounters:   11/28/22 159/82   11/25/22 147/85   08/08/22 149/67     Lab Results   Component Value Date    GFRESTCKDEPI >89 11/27/2022    GFRESTCKDEPI >89 02/20/2022    GFRESTCKDEPI >89 10/23/2021     Medications: hydrochlorothiazide, losartan    -Last visit with  Traci Sermon, MD in Medicine, Cardiovascular Disease on 11/28/2022.    SUBJECTIVE  ROS:    -{hrareportdeny:38345}home BP monitoring  -{hrareportdeny:38345}{hrahtn:37955}  -see LIFESTYLE REVIEW for diet, exercise, alcohol intake, and tobacco use    PLAN  []  Goal BP < 130 mm Hg systolic per ACC/AHA guidelines  []  Patient counseled re: diet, exercise, home BP monitoring, hydration, avoidance of NSAIDS, effect of alcohol intake and tobacco use  []  Continue medication  []  Continue checking BMP  []  Continue follow up with cardiology  []  ER precautions given    Coronary Artery Disease {WITH/WITHOUT:3005700::''with''} Angina    []  New  []  Improved      []  Worse     []  Stable     []  Not Controlled    DATA REVIEW   CAD noted by Dr. Melissa Montane on 11/28/2022.    Relevant labs, screening tests:     CT Coronary Angiogram 02/26/2022:  Calcified plaque at the proximal LAD resulting in mild (under 50%) stenosis. Otherwise, the coronary artery anatomy is conventional without high-grade stenosis. Peripherally calcified bilateral breast prostheses. Unchanged ovoid soft tissue nodule posterior the left silicone implant with internal calcifications    2018 ACC/AHA guidelines recommends high-intensity statin because of ASCVD diagnosis. Patient is only taking a moderate-intensity statin.  10-year ASCVD risk calculator does not apply because patients with ASCVD are recommended to take a high-intensity statinN/A. Patient has ASCVD or LDL>=190 as of 10:56 AM on 12/26/2022    Medication: atorvastatin, ASA 81 mg    -Last visit with  PEYMAN N. NEJATBAKHSH AZADANI,  MD in Medicine, Cardiovascular Disease on 11/28/2022.    SUBJECTIVE  ROS:    -{hrareportdeny:38345}{hracadsx:38024}  -see LIFESTYLE REVIEW for diet, exercise regimen    PLAN:   []  Patient counseled re: interplay between aging, elevated blood pressure, elevated cholesterol, and elevated glucose in development of heart disease  []  Patient counseled re: lifestyle modifications such as diet, cautious physical activity, weight reduction, and smoking avoidance  []  Patient counseled re: monitoring for red flag symptoms  []  Continue blood pressure, cholesterol, and glucose management  []  Follow up with cardiology    Aortic Atherosclerosis    []  New  []  Improved      []  Worse     []  Stable     []  Not stable    DATA REVIEW  Relevant labs, screening tests    CT Coronary Angiogram 02/26/2022:  The visualized ascending thoracic aorta is normal in course and caliber, with mixed calcified and noncalcified atherosclerotic plaque.     Medication: atorvastatin, ASA 81 mg    Last visit with Traci Sermon, MD in Medicine, Cardiovascular Disease on 11/28/2022.     SUBJECTIVE  ROS  -{hrareportdeny:38345}{hracadsx:38024}  -see LIFESTYLE REVIEW for diet and exercise regimen    PLAN  []  Patient counseled re: interplay between aging, elevated BP, elevated cholesterol, and elevated glucose resulting in vascular disease  []  Continue blood pressure, cholesterol, and glucose management  []  Patient counseled re: importance of diet and exercise  []  Follow up with cardiology    Candidate for Statin Therapy    []  New  []  Improved      []  Worse     []  Stable      []  Not Controlled    DATA REVIEW  Relevant labs, screening tests:   Lab Results   Component Value Date    CHOLHDL 64 11/27/2022    CHOLHDL 77 07/14/2022    CHOLHDL 62 10/23/2021     Lab Results   Component Value Date    CHOLDLCAL 66 11/27/2022    CHOLDLCAL 70 07/14/2022    CHOLDLCAL 79 10/23/2021     Lab Results   Component Value Date    TRIGLY 84 11/27/2022    TRIGLY 69 07/14/2022    TRIGLY 52 10/23/2021     Lab Results   Component Value Date    NOHDLCHOCAL 83 11/27/2022    NOHDLCHOCAL 84 07/14/2022    NOHDLCHOCAL 89 10/23/2021        2018 ACC/AHA guidelines recommends high-intensity statin because of ASCVD diagnosis. Patient is only taking a moderate-intensity statin.  10-year ASCVD risk calculator does not apply because patients with ASCVD are recommended to take a high-intensity statinN/A. Patient has ASCVD or LDL>=190 as of 10:59 AM on 12/26/2022  10-year ASCVD risk with optimal risk factors is 8.6%.    Medication: atorvastatin    -Last visit with  [facility] Tyler Aas, MD in Medicine, Cardiovascular Disease on 11/28/2022.    SUBJECTIVE  ROS:    -see LIFESTYLE REVIEW for diet, exercise, alcohol intake    PLAN  []  Patient counseled re: importance of heart healthy diet, 150 minutes of cardiovascular exercise weekly  []  Continue medication  []  Continue follow up with cardiology    Need for Colorectal Cancer Screening      DATA REVIEW   Last colonoscopy:   Last FIT test: 10/28/2021 (positive)  Last Cologard:   Last CT Colonography:     Screening criteria (both required): ***  []  Age 46- 75 years   []   Asymptomatic    SUBJECTIVE  ROS  -{hrareportdeny:38345} abdominal discomfort, bowel changes, or rectal bleeding  -{hrareportdeny:38345} colonoscopy within last 10 years, FIT within last 12 months, Cologard within last 3 years CT colonography within last 5 year  -{hrareportdeny:38345} personal or family history of colorectal cancer, colonic polyps, adenomatous polyps, tubular adenoma, villous adenoma, Crohn's Disease, Ulcerative Colitis or IBD, Familial Polyposis Syndrome or Lynch Syndrome    PLAN:   [x]  Patient counseled re: indication and purpose of colorectal cancer screening; pt {select:30102::''DECLINED''} screening  []  FIT kit ordered; patient advised to pick up kit at PCP office or laboratory  []  Colonoscopy ordered; patient provided with phone number to schedule colonoscopy [phone])  []  Patient advised to bring copy of completed screening to PCP office for scanning into medical records and to determine appropriate screening intervals   [x]  Follow up with PCP     Osteopenia with high risk fracture   []  New  []  Improved      []  Worse     []  Stable     []  Not controlled    DATA REVIEW  Relevant labs, screening tests  DEXA 12/04/2019 (outside records via [facility]):  Lowest T_Score: -2.2 Region: Femur TOTAL HIP Right   FRAX - Ten year risk for hip fracture: 3.6%     Supplement: ***    SUBJECTIVE  ROS:    -{hrareportdeny:38345}falls in last 12 months  -{hrareportdeny:38345}history of fracture   -weekly calcium intake:{hracalciumsource:37971}    PLAN  []  Patient counseled re: fall precautions, calcium and vitamin D supplementation, dietary calcium intake, and weight-bearing exercise  []  Discussed risk/benefit of repeat DEXA; patient {select:30102}  []  DEXA ordered; patient provided with radiology department number to schedule appointment ([phone])  []  Continue ***  []  Follow up with {hraproviders:39697}    Need for vaccination      DATA REVIEW  -Patient meets criteria for the following vaccinations: Prevnar, Shingrix, and COVID-19    SUBJECTIVE  -    PLAN:   [x]  Patient counseled re: indication and purpose vaccination; patient {select:30102}  []  Patient advised to get {hravax:37070} vaccine at PCP office  []  Patient advised to get {hravax:37070} vaccine at pharmacy  []  Patient advised to bring copy of completed vaccinations to PCP office for scanning into medical records    Physical Exam     ***Limited PE due to nature of telehealth visit    VITAL SIGNS  LMP  (LMP Unknown)      PHYSICAL EXAM  Constitutional:  []  NAD []  Cachexia []  Obese    HEENT:  []  Cataracts []  Glasses []  Hearing aids  []  Deferred        Resp:  []  LCTA []  Rales  []  Wheezing    []  O2 use ( L/min)  [x]  Deferred            CV:  []  RRR   []  Irregular HR         []  JVD   []  Murmurs  [x]  Deferred    Extremities:  []  Warm   []  Pedal Pulses:           []  Pallor   []  Swelling:      []  Cap Refill >3 sec    [x]  Deferred        GI:  []  Soft, nontender     []  Tender              []  Distention          [  x] Deferred    MSK:  []  Steady gait   []  Unsteady gait  []  Arthritic nodes  []  Joint swelling  []  Amputations:  []  Strength: RUE _/5 LUE _/5 RLE _/5 LLE _/5  [x]  Deferred    GU:  []  CVAT           [x]  Deferred                Integ:  []  Intact   []  Ulcerations              []  Leg hair absent  []  Artificial openings          []  Bruising   []  Wound:  []  Thickened nails  [x]  Deferred    Neuro:  [x]  Mini-Cog:  ***  []  Tremors              []  Microfilament:       []  Deferred    Psych:  [x]  PHQ-2/9: ***          [x]  GAD-2/7:  ***     []  Withdrawn          []  Anxious/Restless  []  Deferred         Medical History     Past Medical History: Diagnosis Date    Glaucoma     Hypertension       Past Surgical History:   Procedure Laterality Date    BREAST SURGERY             Allergies     Allergies   Allergen Reactions    Amlodipine      Other reaction(s): Other  Edema           Medications      No outpatient medications have been marked as taking for the 12/29/22 encounter (Appointment) with Nina Nordmann, FNP.          Future Appointments      Future Appointments         Provider Department Visit Type    12/29/2022 1:00 PM Nina Nordmann, FNP [facility] VIDEO VISIT -  RETURN    01/01/2023 9:30 AM Gordan Payment., MD [facility] RETURN    01/28/2023 4:15 PM Jerene Dilling Carmin Richmond., MD [facility] RETURN    03/11/2023 1:20 PM (Arrive by 1:05 PM) [facility] BREAST SCREENING IMPL BILAT            Thank you for allowing me the opportunity to participate in the care of your patient.       ***0s minutes were spent personally by me today on this encounter which include today's pre-visit review of the chart, obtaining appropriate history, performing an evaluation, documentation and discussion of management with details supported within the note for today's visit. The time documented was exclusive of any time spent on the separately billed procedure.      {hraPCs:40420}  Health Risk Assessment Program ~~ [facility]  [facility]

## 2022-12-29 ENCOUNTER — Telehealth: Payer: PRIVATE HEALTH INSURANCE | Attending: Family

## 2022-12-29 ENCOUNTER — Telehealth: Payer: PRIVATE HEALTH INSURANCE

## 2022-12-29 NOTE — Telephone Encounter
Patient did not show to scheduled appointment. Call was placed by [facility] 5 minutes after scheduled time. No answer, [facility] was unable to leave voicemail requesting return call. Virtual meeting room was made available for 30 minutes after scheduled time. Call was placed by NP as well, again no answer, unable to leave voicemail.    PCC/Clinic: Upon call-back, please transfer to [phone] or send a message to our [facility] [phone]). Please do not reschedule any HRA appointments.       Nancy Nordmann, FNP-BC  Health Risk Assessment Program ~~ [facility]  [facility]

## 2022-12-29 NOTE — Progress Notes
No show - please disregard

## 2023-01-01 ENCOUNTER — Ambulatory Visit: Payer: PRIVATE HEALTH INSURANCE | Attending: Student in an Organized Health Care Education/Training Program

## 2023-01-06 MED ORDER — LOSARTAN POTASSIUM 50 MG PO TABS
50 mg | ORAL_TABLET | Freq: Every day | ORAL | 3 refills | Status: AC
Start: 2023-01-06 — End: ?

## 2023-01-27 DIAGNOSIS — E785 Hyperlipidemia, unspecified: Secondary | ICD-10-CM

## 2023-01-27 DIAGNOSIS — I2510 Atherosclerotic heart disease of native coronary artery without angina pectoris: Secondary | ICD-10-CM

## 2023-01-27 DIAGNOSIS — I351 Nonrheumatic aortic (valve) insufficiency: Secondary | ICD-10-CM

## 2023-01-27 NOTE — Progress Notes
PRIMARY CARE PHYSICIAN: Eliberto Ivory., MD, MS    OUTPATIENT CARDIOLOGY FOLLOW UP PROGRESS NOTE    Subjective: Nina Cox is a pleasant 73 year old female with history of coronary artery disease, uncontrolled hypertension and dyslipidemia presents to Cardiology Clinic for follow up visit.  During the last visit I put her on hydrochlorothiazide 12.5 mg daily.  Her blood pressure is better controlled but still not at goal.  She also takes losartan 50 mg daily as well.  Today she complains of chronic cough and chronic diarrhea for the past 3 weeks  She is very active physically and sometimes she had chest discomfort in the middle of her chest.  These episodes are not necessarily exertional and lasts for a few minutes.  As a result we got a coronary CT angiogram which showed nonobstructive CAD detailed below.   I also put her on atorvastatin and aspirin but she has not not been taking her atorvastatin regularly. She denies having any shortness of breath, palpitations, orthopnea, presyncope or syncope.     Review of Systems:  Gen: no fevers, chills, or night sweats.  HEENT: no acute visual or hearing changes; no new oral lesions or excess secretions.  Positive for chronic cough  CV: no chest pain, orthopnea, PND, palpitations, or syncope.  Resp: no SOB, DOE, cough, or hemoptysis.  GI: no constipation, hematochezia, or melena.  Positive for chronic diarrhea  GU: no dysuria, urgency, or hematuria.  Musc: no acute joint swelling or pain.  Skin: no acute rashes.  Neuro: no acute numbness, weakness, or seizures.  Psych: no anxiety or depression.  Endocrine: no heat or cold intolerance.  Heme/Lymph: no spontaneous bruising, bleeding, or lymph node swelling.  Allergic/Immunologic: no acute allergic reactions or hives.    Past Medical History:   Diagnosis Date    Glaucoma     Hypertension        Past Surgical History:   Procedure Laterality Date    BREAST SURGERY         Family History   Problem Relation Age of Onset Coronary artery disease Father        Social History     Tobacco Use    Smoking status: Never    Smokeless tobacco: Never   Vaping Use    Vaping status: Never Used   Substance Use Topics    Alcohol use: Yes    Drug use: No       Allergies   Allergen Reactions    Amlodipine      Other reaction(s): Other  Edema      Outpatient Medications Prior to Visit   Medication Sig    ALBUTEROL 90 mcg/act inhaler INHALE 1 PUFF BY MOUTH EVERY 4 HOURS AS NEEDED FOR SHORTNESS OF BREATH OR WHEEZING    aspirin (ASPIRIN LOW DOSE) 81 mg EC tablet TAKE 1 TABLET(81 MG) BY MOUTH DAILY    atorvastatin 10 mg tablet Take 1 tablet (10 mg total) by mouth at bedtime.    doxycycline hyclate 20 mg tablet Take 1 tablet (20 mg total) by mouth two (2) times daily.    hydroCHLOROthiazide 25 mg tablet Take 1 tablet (25 mg total) by mouth daily.    losartan 50 mg tablet Take 1 tablet (50 mg total) by mouth daily.    MIEBO 1.338 GM/ML SOLN INSTILL ONE DROP INTO EACH EYE FOUR TIMES DAILY    prednisoLONE acetate 1% ophthalmic suspension     valACYclovir 1000 mg tablet For oral herpes  recurrent outbreaks: Take Valtrex: 2 grams twice daily x 1 day     No facility-administered medications prior to visit.       EXAM:  LMP  (LMP Unknown)       02/26/2022     1:48 PM 03/14/2022    12:55 PM 03/20/2022     2:40 PM 07/15/2022     1:09 PM 08/08/2022     4:44 PM 11/25/2022     4:39 PM 11/28/2022     4:32 PM   Vitals - 1 value per visit   SYSTOLIC 130 172  152 149 147 045   DIASTOLIC 62 82  57 67 85 82   Heart Rate 60 61  65 67 79 83   Temp     36.6 ?C (97.8 ?F) 36.4 ?C (97.5 ?F)    Resp 18 18  17 16 17 15    Weight (lb)  123 123.02 123 -- 124 --   Height   4' 11'' (1.499 m) 4' 11'' (1.499 m)  5' (1.524 m)    BMI  24.84 kg/m2 24.85 kg/m2 24.84 kg/m2  24.22 kg/m2    SpO2  96 %  94 % 96 % 98 % 96 %   Visit Report  Report  Report Report Report Report       GEN: awake and alert x3, no acute distress. Able to converse.  HEENT: PERRL, EOMI,moist conjunctivae, oral cavity clear; ears and nose atraumatic.   NECK: No significant JVD; +2 carotids bilaterally with no significant bruits.   HEART: RRR with normal S1 and S2, II/VI systolic murmur noted  LUNGS: CTAB bilaterally with no rales or wheezes. Normal respiratory effort.  ABD: soft, NT, ND, +BS.   EXT: non-tender with no edema   MUSC: no joint swelling or effusions with normal range of movement of upper and lower extremities.  SKIN: no acute rashes.    ASCVD Risk Estimation  21.3% is the estimated 10-year risk of atherosclerotic cardiovascular disease (ASCVD) as of 8:42 PM on 01/27/2023  Values used to calculate ASCVD score:  Age: 72 y.o.    Gender: Female Race: Not African American.  64 mg/dL. (measured on 11/27/2022)  147 mg/dL. (measured on 11/27/2022)  159 mm Hg. (measured on 11/28/2022)  Yes  currently not a smoker  No  Click here for the 2013 ACC/AHA Cholesterol Treatment Guideline Summary (PDF).  Click here for the 2013 ACC/AHA Cardiovascular Risk Estimator tool Office manager).    Lab Results   Component Value Date    WBC 6.93 10/23/2021    HGB 17.4 (H) 10/23/2021    HCT 49.7 (H) 10/23/2021    MCV 94.1 10/23/2021    PLT 181 10/23/2021     Lab Results   Component Value Date    CREAT 0.60 11/27/2022    BUN 13 11/27/2022    NA 134 (L) 11/27/2022    K 4.1 11/27/2022    CL 98 11/27/2022    CO2 22 11/27/2022     Lab Results   Component Value Date    HGBA1C 5.4 10/23/2021     Lab Results   Component Value Date    ALT 30 11/27/2022    AST 44 11/27/2022    ALKPHOS 57 11/27/2022    BILITOT 0.5 11/27/2022     Lab Results   Component Value Date    TSH 1.2 10/23/2021     Lab Results   Component Value Date    CALCIUM 9.6 11/27/2022  Lab Results   Component Value Date    CHOL 147 11/27/2022    CHOLHDL 64 11/27/2022    CHOLDLCAL 66 11/27/2022    TRIGLY 84 11/27/2022     Chest CT 08/24/2020: personally reviewed and interpreted, calcified plaques seen in the aorta and left main/ostial LAD      ECG 10/22/2021:  personally reviewed and interpreted. NSR, VR 69 bpm, PR interval 176 ms, QRS duration 80 ms, QTc 441 ms.      Coronary CTA 02/26/2022: personally reviewed and interpreted.      IMPRESSION: Good-quality CT coronary angiogram with no non evaluable segments.  Calcified plaque at the proximal LAD resulting in mild (under 50%) stenosis. Otherwise, the coronary artery anatomy is conventional without high-grade stenosis.  Peripherally calcified bilateral breast prostheses. Unchanged ovoid soft tissue nodule posterior the left silicone implant with internal calcifications. While this finding is unchanged from 2022 prior, consider follow-up with dedicated breast imaging for further evaluation.     Echocardiogram 03/14/2022: personally reviewed and interpreted.    CONCLUSIONS   1. Normal left ventricular size, wall thickness and function (EF 70%).   2. Normal right ventricular size and systolic function.   3. Mild aortic regurgitation.   4. There are no prior studies on this patient for comparison purposes.    ECG 01/28/2023: Ppersonally reviewed and interpreted. NSR, VR bpm, PR interval ms, QRS duration ms, QTc ms.    ASSESSMENT: Nina Cox is a pleasant 73 year old female with history of coronary artery disease, uncontrolled hypertension and dyslipidemia presents to Cardiology Clinic for follow up visit.      PLAN:  #Hypertension:  Not well controlled, I rechecked her blood pressure after a few minutes of resting and was still elevated.  - continue losartan 50 mg regularly  - will increase her hydrochlorothiazide 12.5 mg to 25 mg daily  - repeat her basic metabolic panel in a few weeks to monitor her kidney function and electrolytes  - home blood pressure monitoring instructed  - she will notify me if her blood pressure is elevated at home    #Coronary artery disease:  Seen on chest CT, nonobstructive  - continue aspirin 81 mg after her procedure  - continue atorvastatin 10 mg q.h.s regularly. the importance of taking the medications discussed with the patient in detail    #Dyslipidemia: LDL 79 -> 70 -> 66  - agreed to take atorvastatin 10 mg q.h.s. regularly, a refill was sent to her pharmacy  - will continue to monitor    #Aortic Regurgitation:  New diagnosis.  Discussed with the patient in detail  - will make sure her blood pressure is well controlled and will monitor periodically    #Chronic cough:  Nonproductive  - not on ACE inhibitors  - PFTs were normal in 2023  - will send her back Pulmonary for further evaluation  - she was advised to try antacid medication as a trial    #Diarrhea:  For 3 weeks  - staying hydrated recommended  - will refer her to gastroenterology further evaluation    #Follow up:  4 weeks from now    Thank you for the opportunity to continue to participate in your patient's care.    Antony Haste, MD  [facility] [employer]  Dept. of Medicine, Div. of Cardiology  [facility]  [address],   [place], [place] [address]

## 2023-01-28 ENCOUNTER — Ambulatory Visit: Payer: PRIVATE HEALTH INSURANCE | Attending: Cardiovascular Disease

## 2023-01-28 DIAGNOSIS — E785 Hyperlipidemia, unspecified: Secondary | ICD-10-CM

## 2023-01-28 DIAGNOSIS — I351 Nonrheumatic aortic (valve) insufficiency: Secondary | ICD-10-CM

## 2023-01-28 DIAGNOSIS — I2510 Atherosclerotic heart disease of native coronary artery without angina pectoris: Secondary | ICD-10-CM

## 2023-01-28 NOTE — Progress Notes
PRIMARY CARE PHYSICIAN: Eliberto Ivory., MD, MS    OUTPATIENT CARDIOLOGY FOLLOW UP PROGRESS NOTE    Subjective: Nina Cox is a pleasant 73 year old female with history of coronary artery disease, uncontrolled hypertension and dyslipidemia presents to Cardiology Clinic for follow up visit.  During the last visit I put her on hydrochlorothiazide 12.5 mg daily and increase it to 25 mg.  Her blood pressure is better controlled but still not at goal.  She also takes losartan 50 mg daily as well.  Today she complains of chronic cough and chronic diarrhea for the past 3 weeks  She is very active physically and sometimes she had chest discomfort in the middle of her chest.  These episodes are not necessarily exertional and lasts for a few minutes.  As a result we got a coronary CT angiogram which showed nonobstructive CAD detailed below.   I also put her on atorvastatin and aspirin but she has not not been taking her atorvastatin regularly. She denies having any shortness of breath, palpitations, orthopnea, presyncope or syncope.     Review of Systems:  Gen: no fevers, chills, or night sweats.  HEENT: no acute visual or hearing changes; no new oral lesions or excess secretions.  Positive for chronic cough  CV: no chest pain, orthopnea, PND, palpitations, or syncope.  Resp: no SOB, DOE, cough, or hemoptysis.  GI: no constipation, hematochezia, or melena.  Positive for chronic diarrhea  GU: no dysuria, urgency, or hematuria.  Musc: no acute joint swelling or pain.  Skin: no acute rashes.  Neuro: no acute numbness, weakness, or seizures.  Psych: no anxiety or depression.  Endocrine: no heat or cold intolerance.  Heme/Lymph: no spontaneous bruising, bleeding, or lymph node swelling.  Allergic/Immunologic: no acute allergic reactions or hives.    Past Medical History:   Diagnosis Date    Glaucoma     Hypertension        Past Surgical History:   Procedure Laterality Date    BREAST SURGERY         Family History   Problem Relation Age of Onset    Coronary artery disease Father        Social History     Tobacco Use    Smoking status: Never    Smokeless tobacco: Never   Vaping Use    Vaping status: Never Used   Substance Use Topics    Alcohol use: Yes    Drug use: No       Allergies   Allergen Reactions    Amlodipine      Other reaction(s): Other  Edema      Outpatient Medications Prior to Visit   Medication Sig    ALBUTEROL 90 mcg/act inhaler INHALE 1 PUFF BY MOUTH EVERY 4 HOURS AS NEEDED FOR SHORTNESS OF BREATH OR WHEEZING    aspirin (ASPIRIN LOW DOSE) 81 mg EC tablet TAKE 1 TABLET(81 MG) BY MOUTH DAILY    atorvastatin 10 mg tablet Take 1 tablet (10 mg total) by mouth at bedtime.    doxycycline hyclate 20 mg tablet Take 1 tablet (20 mg total) by mouth two (2) times daily.    hydroCHLOROthiazide 25 mg tablet Take 1 tablet (25 mg total) by mouth daily.    losartan 50 mg tablet Take 1 tablet (50 mg total) by mouth daily.    MIEBO 1.338 GM/ML SOLN INSTILL ONE DROP INTO EACH EYE FOUR TIMES DAILY    prednisoLONE acetate 1% ophthalmic suspension  valACYclovir 1000 mg tablet For oral herpes recurrent outbreaks: Take Valtrex: 2 grams twice daily x 1 day     No facility-administered medications prior to visit.       EXAM:  BP 153/74  ~ Pulse 76  ~ Ht 5' (1.524 m)  ~ LMP  (LMP Unknown)  ~ SpO2 95%  ~ BMI 24.22 kg/m?       03/14/2022    12:55 PM 03/20/2022     2:40 PM 07/15/2022     1:09 PM 08/08/2022     4:44 PM 11/25/2022     4:39 PM 11/28/2022     4:32 PM 01/28/2023     3:48 PM   Vitals - 1 value per visit   SYSTOLIC 172  152 149 147 159 841   DIASTOLIC 82  57 67 85 82 74   Heart Rate 61  65 67 79 83 76   Temp    36.6 ?C (97.8 ?F) 36.4 ?C (97.5 ?F)     Resp 18  17 16 17 15     Weight (lb) 123 123.02 123 -- 124 -- --   Height  4' 11'' (1.499 m) 4' 11'' (1.499 m)  5' (1.524 m)  5' (1.524 m)   BMI 24.84 kg/m2 24.85 kg/m2 24.84 kg/m2  24.22 kg/m2  24.22 kg/m2   SpO2 96 %  94 % 96 % 98 % 96 % 95 %   Visit Report Report  Report Report Report Report Report GEN: awake and alert x3, no acute distress. Able to converse.  HEENT: PERRL, EOMI,moist conjunctivae, oral cavity clear; ears and nose atraumatic.   NECK: No significant JVD; +2 carotids bilaterally with no significant bruits.   HEART: RRR with normal S1 and S2, II/VI systolic and diastolic murmur noted  LUNGS: CTAB bilaterally with no rales or wheezes. Normal respiratory effort.  ABD: soft, NT, ND, +BS.   EXT: non-tender with no edema   MUSC: no joint swelling or effusions with normal range of movement of upper and lower extremities.  SKIN: no acute rashes.    ASCVD Risk Estimation  19.9% is the estimated 10-year risk of atherosclerotic cardiovascular disease (ASCVD) as of 4:18 PM on 01/28/2023  Values used to calculate ASCVD score:  Age: 73 y.o.    Gender: Female Race: Not African American.  64 mg/dL. (measured on 11/27/2022)  147 mg/dL. (measured on 11/27/2022)  153 mm Hg. (measured on 01/28/2023)  Yes  currently not a smoker  No  Click here for the 2013 ACC/AHA Cholesterol Treatment Guideline Summary (PDF).  Click here for the 2013 ACC/AHA Cardiovascular Risk Estimator tool Office manager).    Lab Results   Component Value Date    WBC 6.93 10/23/2021    HGB 17.4 (H) 10/23/2021    HCT 49.7 (H) 10/23/2021    MCV 94.1 10/23/2021    PLT 181 10/23/2021     Lab Results   Component Value Date    CREAT 0.60 01/27/2023    BUN 18 01/27/2023    NA 139 01/27/2023    K 3.7 01/27/2023    CL 99 01/27/2023    CO2 24 01/27/2023     Lab Results   Component Value Date    HGBA1C 5.4 10/23/2021     Lab Results   Component Value Date    ALT 30 11/27/2022    AST 44 11/27/2022    ALKPHOS 57 11/27/2022    BILITOT 0.5 11/27/2022     Lab Results  Component Value Date    TSH 1.2 10/23/2021     Lab Results   Component Value Date    CALCIUM 9.9 01/27/2023     Lab Results   Component Value Date    CHOL 147 11/27/2022    CHOLHDL 64 11/27/2022    CHOLDLCAL 66 11/27/2022    TRIGLY 84 11/27/2022     Chest CT 08/24/2020: personally reviewed and interpreted, calcified plaques seen in the aorta and left main/ostial LAD      ECG 10/22/2021:  personally reviewed and interpreted. NSR, VR 69 bpm, PR interval 176 ms, QRS duration 80 ms, QTc 441 ms.      Coronary CTA 02/26/2022: personally reviewed and interpreted.      IMPRESSION: Good-quality CT coronary angiogram with no non evaluable segments.  Calcified plaque at the proximal LAD resulting in mild (under 50%) stenosis. Otherwise, the coronary artery anatomy is conventional without high-grade stenosis.  Peripherally calcified bilateral breast prostheses. Unchanged ovoid soft tissue nodule posterior the left silicone implant with internal calcifications. While this finding is unchanged from 2022 prior, consider follow-up with dedicated breast imaging for further evaluation.     Echocardiogram 03/14/2022: personally reviewed and interpreted.    CONCLUSIONS   1. Normal left ventricular size, wall thickness and function (EF 70%).   2. Normal right ventricular size and systolic function.   3. Mild aortic regurgitation.   4. There are no prior studies on this patient for comparison purposes.    ECG today 01/28/2023: Ppersonally reviewed and interpreted. NSR with LAFB, can not rule out anterior infarct VR 85 bpm, PR interval 162 ms, QRS duration 84 ms, QTc 447 ms.      ASSESSMENT: Nina Cox is a pleasant 73 year old female with history of coronary artery disease, uncontrolled hypertension and dyslipidemia presents to Cardiology Clinic for follow up visit.      PLAN:  #Hypertension:  Not well controlled, I rechecked her blood pressure after a few minutes of resting and was still elevated.  - will increase her losartan 50 mg to 75 mg daily  - continue hydrochlorothiazide 25 mg daily  - kidney function and electrolytes are within normal limits  - home blood pressure monitoring instructed  - she will notify me if her blood pressure is elevated at home    #Coronary artery disease:  Seen on chest CT, nonobstructive  - continue aspirin 81 mg daily, and she can take it 3 times a week with any bruising  - continue atorvastatin 10 mg q.h.s regularly. the importance of taking the medications discussed with the patient in detail    #Dyslipidemia: LDL 79 -> 70 -> 66  - agreed to take atorvastatin 10 mg q.h.s. regularly, a refill was sent to her pharmacy  - will continue to monitor    #Aortic Regurgitation:  New diagnosis.  Discussed with the patient in detail  - will make sure her blood pressure is well controlled and will repeat her echocardiogram to monitor prior to her next visit    #Chronic cough:  Nonproductive  - not on ACE inhibitors  - PFTs were normal in 2023  - she has been sent back to Pulmonary for further evaluation  - she was advised to try antacid medication as a trial    #Follow up:  3 months from now    Thank you for the opportunity to continue to participate in your patient's care.  55 minutes were spent for direct patient care and reviewing extensive prior  records.      Antony Haste, MD  [facility] [employer]  Dept. of Medicine, Div. of Cardiology  [facility]  [address],   [place], [place] [address]

## 2023-01-29 MED ORDER — LOSARTAN POTASSIUM 25 MG PO TABS
25 mg | ORAL_TABLET | Freq: Every day | ORAL | 3 refills | Status: AC
Start: 2023-01-29 — End: ?

## 2023-03-11 ENCOUNTER — Ambulatory Visit: Payer: PRIVATE HEALTH INSURANCE

## 2023-03-27 ENCOUNTER — Telehealth: Payer: PRIVATE HEALTH INSURANCE

## 2023-03-27 NOTE — Telephone Encounter
 Orders Request    What is being requested? (Tests, Labs, Imaging, etc.):   Labs   Reason for the request: Patient advised the last two times she was seen by the doctor, Dr. Rutherford Nail had wanted the patient to have labs done but there are no orders.     Where does the patient want to be seen?  [facility]     If outside [facility], what is the fax number to the facility?      Has the patient seen their doctor for this matter? Yes     Last office visit: 11/25/22    Patient or caller was offered an appointment but declined.    Patient or caller has been notified of the turnaround time of 1-2 business day(s).

## 2023-03-27 NOTE — Telephone Encounter
 Called pt, would like to know if she needs labs done prior to her appt on 4/25 with Dr Soyla Dryer. Please advise.

## 2023-03-27 NOTE — Telephone Encounter
 Disregard

## 2023-03-31 NOTE — Telephone Encounter
 Patient was notified.

## 2023-04-13 ENCOUNTER — Telehealth: Payer: PRIVATE HEALTH INSURANCE

## 2023-04-13 MED ORDER — VALACYCLOVIR HCL 1 G PO TABS
ORAL_TABLET | 0 refills
Start: 2023-04-13 — End: ?

## 2023-04-13 NOTE — Telephone Encounter
 Referral Request    Per patient states she never used her Ophthalmology referral and is asking if we can resubmit.     1) Patient is requesting a referral to:    Ophthalmology   Specific location?     [facility]      Particular MD in mind?     2) The issue (diagnosis, symptoms):   Routine eye exams  3) Has patient been seen by their doctor for this issue?    N/A  4) Was an appointment offered?   No, patient asked for a message to be sent instead.   5) Patient's last office visit: 11/2022    Patient or caller has been notified of the turnaround time of 1-2 business day(s).

## 2023-04-15 MED ORDER — VALACYCLOVIR HCL 1 G PO TABS
2000 mg | ORAL_TABLET | Freq: Two times a day (BID) | ORAL | 0 refills | Status: AC | PRN
Start: 2023-04-15 — End: ?

## 2023-04-15 NOTE — Telephone Encounter
 Pt had an ophthalmology referral placed by Dr Kieran Pellet and it expired in 2024.  Pt is requesting to have another order/referral placed.   Pt is scheduled to see you on 04/16/23 and she has a provider transfer appt scheduled with you on 05/20/23.  Please advise if you are able to place the referral or if I should reach out to Dr Kieran Pellet to have it placed.     Thank you

## 2023-04-16 ENCOUNTER — Ambulatory Visit: Payer: PRIVATE HEALTH INSURANCE | Attending: Student in an Organized Health Care Education/Training Program

## 2023-04-30 DIAGNOSIS — R053 Chronic cough: Secondary | ICD-10-CM

## 2023-04-30 DIAGNOSIS — E785 Hyperlipidemia, unspecified: Secondary | ICD-10-CM

## 2023-04-30 DIAGNOSIS — I351 Nonrheumatic aortic (valve) insufficiency: Secondary | ICD-10-CM

## 2023-04-30 DIAGNOSIS — I2510 Atherosclerotic heart disease of native coronary artery without angina pectoris: Secondary | ICD-10-CM

## 2023-04-30 DIAGNOSIS — K529 Noninfective gastroenteritis and colitis, unspecified: Secondary | ICD-10-CM

## 2023-04-30 NOTE — Progress Notes
 PRIMARY CARE PHYSICIAN: Lus Salter., MD, MS    OUTPATIENT CARDIOLOGY FOLLOW UP PROGRESS NOTE    Subjective: Nina Cox is a pleasant 73 year old female with history of coronary artery disease, uncontrolled hypertension and dyslipidemia presents to Cardiology Clinic for follow up visit.  During the last visit I put her on hydrochlorothiazide  12.5 mg daily and increase it to 25 mg.  Her blood pressure is better controlled but still not at goal.  She also takes losartan  50 mg daily as well.  Today she complains of chronic cough and chronic diarrhea for the past 3 weeks  She is very active physically and sometimes she had chest discomfort in the middle of her chest.  These episodes are not necessarily exertional and lasts for a few minutes.  As a result we got a coronary CT angiogram which showed nonobstructive CAD detailed below.   I also put her on atorvastatin  and aspirin  but she has not not been taking her atorvastatin  regularly. She denies having any shortness of breath, palpitations, orthopnea, presyncope or syncope.     Review of Systems:  Gen: no fevers, chills, or night sweats.  HEENT: no acute visual or hearing changes; no new oral lesions or excess secretions.  Positive for chronic cough  CV: no chest pain, orthopnea, PND, palpitations, or syncope.  Resp: no SOB, DOE, cough, or hemoptysis.  GI: no constipation, hematochezia, or melena.  Positive for chronic diarrhea  GU: no dysuria, urgency, or hematuria.  Musc: no acute joint swelling or pain.  Skin: no acute rashes.  Neuro: no acute numbness, weakness, or seizures.  Psych: no anxiety or depression.  Endocrine: no heat or cold intolerance.  Heme/Lymph: no spontaneous bruising, bleeding, or lymph node swelling.  Allergic/Immunologic: no acute allergic reactions or hives.    Past Medical History:   Diagnosis Date    Glaucoma     Hypertension        Past Surgical History:   Procedure Laterality Date    BREAST SURGERY         Family History   Problem Relation Age of Onset    Coronary artery disease Father        Social History     Tobacco Use    Smoking status: Never    Smokeless tobacco: Never   Vaping Use    Vaping status: Never Used   Substance Use Topics    Alcohol use: Yes    Drug use: No       Allergies   Allergen Reactions    Amlodipine      Other reaction(s): Other  Edema      Outpatient Medications Prior to Visit   Medication Sig    ALBUTEROL  90 mcg/act inhaler INHALE 1 PUFF BY MOUTH EVERY 4 HOURS AS NEEDED FOR SHORTNESS OF BREATH OR WHEEZING    aspirin  (ASPIRIN  LOW DOSE) 81 mg EC tablet TAKE 1 TABLET(81 MG) BY MOUTH DAILY    atorvastatin  10 mg tablet Take 1 tablet (10 mg total) by mouth at bedtime.    doxycycline hyclate 20 mg tablet Take 1 tablet (20 mg total) by mouth two (2) times daily.    hydroCHLOROthiazide  25 mg tablet Take 1 tablet (25 mg total) by mouth daily.    losartan  25 mg tablet Take 1 tablet (25 mg total) by mouth daily.    losartan  50 mg tablet Take 1 tablet (50 mg total) by mouth daily.    MIEBO 1.338 GM/ML SOLN INSTILL ONE DROP  INTO EACH EYE FOUR TIMES DAILY    prednisoLONE acetate 1% ophthalmic suspension     valACYclovir 1000 mg tablet Take 2 tablets (2,000 mg total) by mouth two (2) times daily as needed (for 1 day per episode).     No facility-administered medications prior to visit.       EXAM:  LMP  (LMP Unknown)       03/14/2022    12:55 PM 03/20/2022     2:40 PM 07/15/2022     1:09 PM 08/08/2022     4:44 PM 11/25/2022     4:39 PM 11/28/2022     4:32 PM 01/28/2023     3:48 PM   Vitals - 1 value per visit   SYSTOLIC 172  152 149 147 159 784   DIASTOLIC 82  57 67 85 82 74   Heart Rate 61  65 67 79 83 76   Temp    36.6 ?C (97.8 ?F) 36.4 ?C (97.5 ?F)     Resp 18  17 16 17 15     Weight (lb) 123 123.02 123 -- 124 -- --   Height  4' 11'' (1.499 m) 4' 11'' (1.499 m)  5' (1.524 m)  5' (1.524 m)   BMI 24.84 kg/m2 24.85 kg/m2 24.84 kg/m2  24.22 kg/m2  24.22 kg/m2   SpO2 96 %  94 % 96 % 98 % 96 % 95 %   Visit Report Report  Report Report Report Report Report       GEN: awake and alert x3, no acute distress. Able to converse.  HEENT: PERRL, EOMI,moist conjunctivae, oral cavity clear; ears and nose atraumatic.   NECK: No significant JVD; +2 carotids bilaterally with no significant bruits.   HEART: RRR with normal S1 and S2, II/VI systolic and diastolic murmur noted  LUNGS: CTAB bilaterally with no rales or wheezes. Normal respiratory effort.  ABD: soft, NT, ND, +BS.   EXT: non-tender with no edema   MUSC: no joint swelling or effusions with normal range of movement of upper and lower extremities.  SKIN: no acute rashes.    ASCVD Risk Estimation  19.9% is the estimated 10-year risk of atherosclerotic cardiovascular disease (ASCVD) as of 6:04 PM on 04/30/2023  Values used to calculate ASCVD score:  Age: 73 y.o.    Gender: Female Race: Not African American.  64 mg/dL. (measured on 11/27/2022)  147 mg/dL. (measured on 11/27/2022)  153 mm Hg. (measured on 01/28/2023)  Yes  currently not a smoker  No  Click here for the 2013 ACC/AHA Cholesterol Treatment Guideline Summary (PDF).  Click here for the 2013 ACC/AHA Cardiovascular Risk Estimator tool Office manager).    Lab Results   Component Value Date    WBC 6.93 10/23/2021    HGB 17.4 (H) 10/23/2021    HCT 49.7 (H) 10/23/2021    MCV 94.1 10/23/2021    PLT 181 10/23/2021     Lab Results   Component Value Date    CREAT 0.60 01/27/2023    BUN 18 01/27/2023    NA 139 01/27/2023    K 3.7 01/27/2023    CL 99 01/27/2023    CO2 24 01/27/2023     Lab Results   Component Value Date    HGBA1C 5.4 10/23/2021     Lab Results   Component Value Date    ALT 30 11/27/2022    AST 44 11/27/2022    ALKPHOS 57 11/27/2022    BILITOT 0.5 11/27/2022  Lab Results   Component Value Date    TSH 1.2 10/23/2021     Lab Results   Component Value Date    CALCIUM 9.9 01/27/2023     Lab Results   Component Value Date    CHOL 147 11/27/2022    CHOLHDL 64 11/27/2022    CHOLDLCAL 66 11/27/2022    TRIGLY 84 11/27/2022     Chest CT 08/24/2020: personally reviewed and interpreted, calcified plaques seen in the aorta and left main/ostial LAD      ECG 10/22/2021:  personally reviewed and interpreted. NSR, VR 69 bpm, PR interval 176 ms, QRS duration 80 ms, QTc 441 ms.      Coronary CTA 02/26/2022: personally reviewed and interpreted.      IMPRESSION: Good-quality CT coronary angiogram with no non evaluable segments.  Calcified plaque at the proximal LAD resulting in mild (under 50%) stenosis. Otherwise, the coronary artery anatomy is conventional without high-grade stenosis.  Peripherally calcified bilateral breast prostheses. Unchanged ovoid soft tissue nodule posterior the left silicone implant with internal calcifications. While this finding is unchanged from 2022 prior, consider follow-up with dedicated breast imaging for further evaluation.     Echocardiogram 03/14/2022: personally reviewed and interpreted.    CONCLUSIONS   1. Normal left ventricular size, wall thickness and function (EF 70%).   2. Normal right ventricular size and systolic function.   3. Mild aortic regurgitation.   4. There are no prior studies on this patient for comparison purposes.    ECG 01/28/2023: personally reviewed and interpreted. NSR with LAFB, can not rule out anterior infarct VR 85 bpm, PR interval 162 ms, QRS duration 84 ms, QTc 447 ms.    Echocardiogram today 05/01/2023: Performed in clinic and personally reviewed and interpreted.       ASSESSMENT: Nina Cox is a pleasant 73 year old female with history of coronary artery disease, uncontrolled hypertension and dyslipidemia presents to Cardiology Clinic for follow up visit.      PLAN:  #Hypertension:  Not well controlled, I rechecked her blood pressure after a few minutes of resting and was still elevated.  - will increase her losartan 50 mg to 75 mg daily  - continue hydrochlorothiazide 25 mg daily  - kidney function and electrolytes are within normal limits  - home blood pressure monitoring instructed  - she will notify me if her blood pressure is elevated at home    #Coronary artery disease:  Seen on chest CT, nonobstructive  - continue aspirin 81 mg daily, and she can take it 3 times a week with any bruising  - continue atorvastatin 10 mg q.h.s regularly. the importance of taking the medications discussed with the patient in detail    #Dyslipidemia: LDL 79 -> 70 -> 66  - agreed to take atorvastatin 10 mg q.h.s. regularly, a refill was sent to her pharmacy  - will continue to monitor    #Aortic Regurgitation:  New diagnosis.  Discussed with the patient in detail  - will make sure her blood pressure is well controlled and will repeat her echocardiogram to monitor prior to her next visit    #Chronic cough:  Nonproductive  - not on ACE inhibitors  - PFTs were normal in 2023  - she has been sent back to Pulmonary for further evaluation  - she was advised to try antacid medication as a trial    #Follow up:  3 months from now    Thank you for the opportunity to continue to  participate in your patient's care.  55 minutes were spent for direct patient care and reviewing extensive prior records.      Meda Spies, MD  [place] [employer]  Dept. of Medicine, Div. of Cardiology  [facility]  [address],   [place], [place]  [address]

## 2023-05-01 ENCOUNTER — Ambulatory Visit: Payer: PRIVATE HEALTH INSURANCE | Attending: Cardiovascular Disease

## 2023-05-13 MED ORDER — VALACYCLOVIR HCL 1 G PO TABS
2000 mg | ORAL_TABLET | Freq: Two times a day (BID) | ORAL | 0 refills | 30.00000 days | Status: AC | PRN
Start: 2023-05-13 — End: ?

## 2023-05-20 ENCOUNTER — Ambulatory Visit: Payer: PRIVATE HEALTH INSURANCE | Attending: Student in an Organized Health Care Education/Training Program

## 2023-05-27 ENCOUNTER — Ambulatory Visit: Payer: PRIVATE HEALTH INSURANCE

## 2023-05-28 DIAGNOSIS — I2510 Atherosclerotic heart disease of native coronary artery without angina pectoris: Secondary | ICD-10-CM

## 2023-05-28 DIAGNOSIS — E785 Hyperlipidemia, unspecified: Secondary | ICD-10-CM

## 2023-05-28 DIAGNOSIS — R053 Chronic cough: Secondary | ICD-10-CM

## 2023-05-28 DIAGNOSIS — K529 Noninfective gastroenteritis and colitis, unspecified: Secondary | ICD-10-CM

## 2023-05-28 DIAGNOSIS — I351 Nonrheumatic aortic (valve) insufficiency: Secondary | ICD-10-CM

## 2023-05-28 NOTE — Progress Notes
 PRIMARY CARE PHYSICIAN: Lus Salter., MD, MS    OUTPATIENT CARDIOLOGY FOLLOW UP PROGRESS NOTE    Subjective: Nina Cox is a pleasant 73 year old female with history of coronary artery disease, uncontrolled hypertension and dyslipidemia presents to Cardiology Clinic for follow up visit.  During the last visit I put her on hydrochlorothiazide 12.5 mg daily and increase it to 25 mg.  Her blood pressure is better controlled but still not at goal.  She also takes losartan 75 mg daily as well.  She has been under stress daily.  Prior she had episode of chest discomfort.  These episodes are not necessarily exertional and lasts for a few minutes.  As a result we got a coronary CT angiogram which showed nonobstructive CAD detailed below.   I also put her on atorvastatin and aspirin but she has not not been taking her atorvastatin regularly. She denies having any shortness of breath, palpitations, orthopnea, presyncope or syncope.     Review of Systems:  Gen: no fevers, chills, or night sweats.  HEENT: no acute visual or hearing changes; no new oral lesions or excess secretions.  Positive for chronic cough  CV: no chest pain, orthopnea, PND, palpitations, or syncope.  Resp: no SOB, DOE, cough, or hemoptysis.  GI: no constipation, hematochezia, or melena.  Positive for chronic diarrhea  GU: no dysuria, urgency, or hematuria.  Musc: no acute joint swelling or pain.  Skin: no acute rashes.  Neuro: no acute numbness, weakness, or seizures.  Psych: no anxiety or depression.  Endocrine: no heat or cold intolerance.  Heme/Lymph: no spontaneous bruising, bleeding, or lymph node swelling.  Allergic/Immunologic: no acute allergic reactions or hives.    Past Medical History:   Diagnosis Date    Glaucoma     Hypertension        Past Surgical History:   Procedure Laterality Date    BREAST SURGERY         Family History   Problem Relation Age of Onset    Coronary artery disease Father        Social History     Tobacco Use Smoking status: Never    Smokeless tobacco: Never   Vaping Use    Vaping status: Never Used   Substance Use Topics    Alcohol use: Yes    Drug use: No       Allergies   Allergen Reactions    Amlodipine      Other reaction(s): Other  Edema      Outpatient Medications Prior to Visit   Medication Sig    ALBUTEROL 90 mcg/act inhaler INHALE 1 PUFF BY MOUTH EVERY 4 HOURS AS NEEDED FOR SHORTNESS OF BREATH OR WHEEZING    aspirin (ASPIRIN LOW DOSE) 81 mg EC tablet TAKE 1 TABLET(81 MG) BY MOUTH DAILY    atorvastatin 10 mg tablet Take 1 tablet (10 mg total) by mouth at bedtime.    doxycycline hyclate 20 mg tablet Take 1 tablet (20 mg total) by mouth two (2) times daily.    hydroCHLOROthiazide 25 mg tablet Take 1 tablet (25 mg total) by mouth daily.    losartan 25 mg tablet Take 1 tablet (25 mg total) by mouth daily.    losartan 50 mg tablet Take 1 tablet (50 mg total) by mouth daily.    MIEBO 1.338 GM/ML SOLN INSTILL ONE DROP INTO EACH EYE FOUR TIMES DAILY    prednisoLONE acetate 1% ophthalmic suspension     valACYclovir 1000 mg  tablet Take 2 tablets (2,000 mg total) by mouth two (2) times daily as needed (for 1 day per episode).     No facility-administered medications prior to visit.       EXAM:  BP (!) 180/66  ~ Pulse 67  ~ Ht 5' (1.524 m)  ~ LMP  (LMP Unknown)  ~ SpO2 97%  ~ BMI 24.22 kg/m?       03/20/2022     2:40 PM 07/15/2022     1:09 PM 08/08/2022     4:44 PM 11/25/2022     4:39 PM 11/28/2022     4:32 PM 01/28/2023     3:48 PM 06/03/2023     4:06 PM   Vitals - 1 value per visit   SYSTOLIC  152 149 147 159 153 696   DIASTOLIC  57 67 85 82 74 66   Heart Rate  65 67 79 83 76 67   Temp   36.6 ?C (97.8 ?F) 36.4 ?C (97.5 ?F)      Resp  17 16 17 15      Weight (lb) 123.02 123 -- 124 -- -- --   Height 4' 11'' (1.499 m) 4' 11'' (1.499 m)  5' (1.524 m)  5' (1.524 m) 5' (1.524 m)   BMI 24.85 kg/m2 24.84 kg/m2  24.22 kg/m2  24.22 kg/m2 24.22 kg/m2   SpO2  94 % 96 % 98 % 96 % 95 % 97 %   Visit Report  Report Report Report Report Report Report       GEN: awake and alert x3, no acute distress. Able to converse.  HEENT: PERRL, EOMI,moist conjunctivae, oral cavity clear; ears and nose atraumatic.   NECK: No significant JVD; +2 carotids bilaterally with no significant bruits.   HEART: RRR with normal S1 and S2, unchanged II/VI systolic and diastolic murmur noted  LUNGS: CTAB bilaterally with no rales or wheezes. Normal respiratory effort.  ABD: soft, NT, ND, +BS.   EXT: non-tender with no edema   MUSC: no joint swelling or effusions with normal range of movement of upper and lower extremities.  SKIN: no acute rashes.    ASCVD Risk Estimation  26.5% is the estimated 10-year risk of atherosclerotic cardiovascular disease (ASCVD) as of 4:37 PM on 06/03/2023  Values used to calculate ASCVD score:  Age: 73 y.o.    Gender: Female Race: Not African American.  64 mg/dL. (measured on 11/27/2022)  147 mg/dL. (measured on 11/27/2022)  180 mm Hg. (measured on 06/03/2023)  Yes  currently not a smoker  No  Click here for the 2013 ACC/AHA Cholesterol Treatment Guideline Summary (PDF).  Click here for the 2013 ACC/AHA Cardiovascular Risk Estimator tool Office manager).    Lab Results   Component Value Date    WBC 6.93 10/23/2021    HGB 17.4 (H) 10/23/2021    HCT 49.7 (H) 10/23/2021    MCV 94.1 10/23/2021    PLT 181 10/23/2021     Lab Results   Component Value Date    CREAT 0.54 (L) 06/03/2023    BUN 14 06/03/2023    NA 140 06/03/2023    K 3.9 06/03/2023    CL 107 (H) 06/03/2023    CO2 19 (L) 06/03/2023     Lab Results   Component Value Date    HGBA1C 5.4 10/23/2021     Lab Results   Component Value Date    ALT 30 11/27/2022    AST 44 11/27/2022    ALKPHOS  57 11/27/2022    BILITOT 0.5 11/27/2022     Lab Results   Component Value Date    TSH 1.2 10/23/2021     Lab Results   Component Value Date    CALCIUM 10.1 06/03/2023     Lab Results   Component Value Date    CHOL 147 11/27/2022    CHOLHDL 64 11/27/2022    CHOLDLCAL 66 11/27/2022    TRIGLY 84 11/27/2022     Chest CT 08/24/2020: personally reviewed and interpreted, calcified plaques seen in the aorta and left main/ostial LAD      ECG 10/22/2021:  personally reviewed and interpreted. NSR, VR 69 bpm, PR interval 176 ms, QRS duration 80 ms, QTc 441 ms.      Coronary CTA 02/26/2022: personally reviewed and interpreted.      IMPRESSION: Good-quality CT coronary angiogram with no non evaluable segments.  Calcified plaque at the proximal LAD resulting in mild (under 50%) stenosis. Otherwise, the coronary artery anatomy is conventional without high-grade stenosis.  Peripherally calcified bilateral breast prostheses. Unchanged ovoid soft tissue nodule posterior the left silicone implant with internal calcifications. While this finding is unchanged from 2022 prior, consider follow-up with dedicated breast imaging for further evaluation.     Echocardiogram 03/14/2022: personally reviewed and interpreted.    CONCLUSIONS   1. Normal left ventricular size, wall thickness and function (EF 70%).   2. Normal right ventricular size and systolic function.   3. Mild aortic regurgitation.   4. There are no prior studies on this patient for comparison purposes.    ECG 01/28/2023: personally reviewed and interpreted. NSR with LAFB, can not rule out anterior infarct VR 85 bpm, PR interval 162 ms, QRS duration 84 ms, QTc 447 ms.    Echocardiogram today 06/03/2023: Performed in clinic and personally reviewed and interpreted.   CONCLUSIONS   1. Normal left ventricular size and function (EF 61%).   2. Mild concentric left ventricular hypertrophy.   3. Normal right ventricular size and systolic function.   4. Trivial aortic regurgitation otherwise no significant valvular abnormalities..   5. PA systolic pressure cannot be calculated due to faint TR jet.   6. Compared to prior study on 03/14/2022, mild LVH is not present otherwise no significant changes.      ASSESSMENT: Nina Cox is a pleasant 73 year old female with history of coronary artery disease, uncontrolled hypertension and dyslipidemia presents to Cardiology Clinic for follow up visit.      PLAN:  #Hypertension:  Not well controlled, I rechecked her blood pressure after a few minutes of resting and was still elevated.  Now mild LVH on echocardiogram  - will increase her losartan to 100 mg daily  - continue hydrochlorothiazide 25 mg daily  - kidney function and electrolytes are within normal limits  - home blood pressure monitoring instructed  - she will notify me if her blood pressure is elevated at home  - will add amlodipine 2.5 mg if her blood thinners sure continues to be elevated    #Coronary artery disease:  Seen on chest CT, nonobstructive  - continue aspirin 81 mg daily, and she can take it 3 times a week with any bruising  - continue atorvastatin 10 mg q.h.s regularly. the importance of taking the medications discussed with the patient in detail    #Dyslipidemia: LDL 79 -> 70 -> 66  - agreed to take atorvastatin 10 mg q.h.s. regularly, a refill was sent to her pharmacy  - will  continue to monitor    #Aortic Regurgitation:  New diagnosis.  Discussed with the patient in detail  - will make sure her blood pressure is well controlled and will repeat her echocardiogram to monitor prior to her next visit    #Chronic cough:  Nonproductive  - not on ACE inhibitors  - PFTs were normal in 2023  - she has been sent back to Pulmonary for further evaluation  - she was advised to try antacid medication as a trial    #Follow up:  2 months from now    Thank you for the opportunity to continue to participate in your patient's care.  51 minutes were spent personally by me today on this encounter which include today's pre-visit review of chart, obtaining appropriate history, performing an evaluation, documentation and discussion of management with details supported within the note for today's visit. The time documented was exclusive of any time spent of the separately billed procedure.         Meda Spies, MD  [employer] [employer]  Dept. of Medicine, Div. of Cardiology  [facility]  [address],   [place], [place]  [address]

## 2023-06-03 ENCOUNTER — Ambulatory Visit: Payer: PRIVATE HEALTH INSURANCE | Attending: Cardiovascular Disease

## 2023-06-03 MED ORDER — LOSARTAN POTASSIUM-HCTZ 100-25 MG PO TABS
1 | ORAL_TABLET | Freq: Every day | ORAL | 3 refills | 90.00000 days | Status: AC
Start: 2023-06-03 — End: ?

## 2023-06-04 ENCOUNTER — Ambulatory Visit: Payer: PRIVATE HEALTH INSURANCE | Attending: Student in an Organized Health Care Education/Training Program

## 2023-06-22 ENCOUNTER — Ambulatory Visit: Payer: PRIVATE HEALTH INSURANCE

## 2023-06-22 DIAGNOSIS — H04123 Dry eye syndrome of bilateral lacrimal glands: Secondary | ICD-10-CM

## 2023-06-22 DIAGNOSIS — H401130 Primary open-angle glaucoma, bilateral, stage unspecified: Secondary | ICD-10-CM

## 2023-06-22 DIAGNOSIS — M25561 Pain in right knee: Secondary | ICD-10-CM

## 2023-06-22 DIAGNOSIS — R197 Diarrhea, unspecified: Secondary | ICD-10-CM

## 2023-06-22 DIAGNOSIS — R2689 Other abnormalities of gait and mobility: Secondary | ICD-10-CM

## 2023-06-22 DIAGNOSIS — M25562 Pain in left knee: Secondary | ICD-10-CM

## 2023-06-22 DIAGNOSIS — G8929 Other chronic pain: Secondary | ICD-10-CM

## 2023-06-22 MED ORDER — PREDNISOLONE ACETATE 1 % OP SUSP
2 [drp] | Freq: Two times a day (BID) | OPHTHALMIC | 0 refills | 27.00000 days | Status: AC
Start: 2023-06-22 — End: ?

## 2023-06-22 MED ORDER — MIEBO 1.338 GM/ML OP SOLN
1.338 mL | Freq: Every day | OPHTHALMIC | 0 refills | 22.50000 days | Status: AC
Start: 2023-06-22 — End: 2023-06-24

## 2023-06-22 NOTE — Progress Notes
 [facility]  Outpatient Progress Note  PMD: Ranee Terril SAUNDERS., MD, MS  06/22/2023      Chief Complaint   Patient presents with    Diarrhea     For months     Knee Pain     Patient is requesting knee injections.     Referral / Auth     Ophthalmology for glaucoma. Patient needs refills on her eye drops.        Subjective:      NASYA VINCENT is a 72 y.o. 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 Laurell in Nov and had xr ordered. Didn't complete it iyet  - goes to the gym a few times a week, hasn't done PT    #glaucoma  - was previously seeing ophthalmology in [address], hasn't been able to go can't get refills  - needs new ophthalmologist here         No other acute concerns    Objective (click to expand/collapse)     Review of Systems - A complete 14-system review of system was performed with pertinent positives and negatives included above and in the HPI and otherwise reviewed and found to be negative and/or non-contributory.      Past Medical History:   Diagnosis Date    Glaucoma     Hypertension      Past Surgical History:   Procedure Laterality Date    BREAST SURGERY         Medications: reviewed medication list in the chart  Medications that the patient states to be currently taking   Medication Sig    ALBUTEROL 90 mcg/act inhaler INHALE 1 PUFF BY MOUTH EVERY 4 HOURS AS NEEDED FOR SHORTNESS OF BREATH OR WHEEZING    aspirin (ASPIRIN LOW DOSE) 81 mg EC tablet TAKE 1 TABLET(81 MG) BY MOUTH DAILY    atorvastatin 10 mg tablet Take 1 tablet (10 mg total) by mouth at bedtime.    losartan-hydroCHLOROthiazide 100-25 mg tablet Take 1 tablet by mouth daily.    MIEBO 1.338 GM/ML SOLN INSTILL ONE DROP INTO EACH EYE FOUR TIMES DAILY    prednisoLONE acetate 1% ophthalmic suspension     valACYclovir 1000 mg tablet Take 2 tablets (2,000 mg total) by mouth two (2) times daily as needed (for 1 day per episode).       Allergies: Amlodipine        Objective:      BP 164/70  ~ Pulse 66  ~ Temp 36.7 ?C (98 ?F) (Oral)  ~ Resp 17  ~ Wt 120 lb (54.4 kg)  ~ LMP  (LMP Unknown)  ~ SpO2 96%  ~ BMI 23.44 kg/m?     General appearance - alert, well appearing, and in no distress  Musculoskeletal - full passive ROM with no pain. Mc murry negative. Pain with patellar grind test.  Psych - mood and affect appropriate     Labs:  Results for orders placed or performed in  visit on 06/03/23   Echo adult transthoracic complete   Result Value Ref Range    Left Ventricular Ejection Fraction 61 %         Imaging:  Echo adult transthoracic complete  Result Date: 06/03/2023       [facility]  [address]      [place], [place] [address]        Phone: [phone]  TRANSTHORACIC ECHOCARDIOGRAM REPORT  Patient Name:       LEIMOMI ZERVAS                   Date of Exam:   06/03/2023 Medical Rec #:      [id number]                           Accession #:    [id number] Date of Birth:      1950/02/21                         Height:         60 in Age:                72 years                          Weight:         124 lbs Gender:             F                                 BSA:            1.52 m Referring           [id number] PEYMAN N. NEJATBAKHSH      Blood Pressure: 153/74 Physician:          AZADANI                                           mmHg Diagnosis: I27.0-Hypertension  MEASUREMENTS:  LVIDd (2D) 3.48 cm (index 2.28 cm/m) LVIDs (2D) 2.22 cm (index 1.46 cm/m) IVSd (2D)  1.23 cm                    LVPWd (2D) 1.21 cm  FINDINGS: LEFT VENTRICLE: The left ventricular size is normal. Mild concentric left ventricular hypertrophy. Normal LV regional wall motion. The left ventricular systolic function is normal. The ejection fraction by Simpson's Biplane method is 61 %. Normal LV diastolic function. Normal left atrial pressure. MV deceleration time is 277 msec. MV E velocity is 0.80 m/s. MV A velocity is 1.27 m/s. E/A ratio is 0.63. Lateral e' velocity is 5.1 cm/s. Medial e' velocity is 6.6 cm/s. Lateral E/e' ratio is 15.7. Medial E/e' ratio is 12.1. Averaged E/e' ratio is 13.7. LEFT ATRIUM: The left atrium is normal in size. The interatrial septum is non-aneurysmal. The left atrium size (2D) is 2.70 cm. The LA volume (Biplane method) is 33.2 ml. The LA Volume index is 21.8 ml/m. RIGHT ATRIUM: The right atrium is normal in size. RA area is 8.6 cm. RA volume is 14.3 ml. The RA volume index is 9.4 ml/m. RIGHT VENTRICLE: The right ventricular size  is normal. Global RV systolic function is normal. TAPSE is 28 mm. The RV free wall tissue Doppler S' wave measures 16.4 cm/s. The right ventricle basal diameter measures 22 mm. The right ventricle mid cavity measures 23 mm. The right ventricle longitudinal diameter measures 65 mm. MITRAL VALVE: The mitral valve appears normal in structure. Mitral annular calcification noted. There is no prolapse of the mitral valve. Trace mitral valve regurgitation. There is no mitral valve stenosis. AORTIC VALVE: The aortic valve appears trileaflet and sclerotic. Trivial aortic valve regurgitation. The LVOT velocity is 1.10 m/s. The peak aortic valve velocity is 1.46 m/s. No aortic valve stenosis. TRICUSPID VALVE: The tricuspid valve appears normal in structure. There is no tricuspid valve stenosis. Trace tricuspid regurgitation is present. PULMONIC VALVE: Trace pulmonary valve regurgitation. No evidence of pulmonic valve stenosis. AORTA: The aortic valve annulus measures 19 mm (index 13 mm/m). The sinus of Valsalva measures 29 mm (index 19 mm/m) The sinotubular junction measures 23 mm (index 15 mm/m). The proximal ascending aorta measures 32 mm (index 21 mm/m). PULMONARY ARTERY: PA systolic pressure cannot be calculated due to faint TR jet. IVC: Normal inferior vena cava in diameter. There is greater than 50% collapse of the IVC during respiration. Normal right atrial pressure. PERICARDIUM: There is no pericardial effusion.       1. Normal left ventricular size and function (EF 61%).  2. Mild concentric left ventricular hypertrophy.  3. Normal right ventricular size and systolic function.  4. Trivial aortic regurgitation otherwise no significant valvular abnormalities..  5. PA systolic pressure cannot be calculated due to faint TR jet.  6. Compared to prior study on 03/14/2022, mild LVH is not present otherwise no significant changes. [id number] Peyman Cipriano Schiller MD Electronically signed by [id number] Emi Cipriano Schiller MD on 06/03/2023 at 4:41:52 PM  Sonographer: Edmon Alley RDCS   Final            ASSESSMENT and PLAN:      EMILEY DIGIACOMO is a 73 y.o. female who presents for   Chief Complaint   Patient presents with    Diarrhea     For months     Knee Pain     Patient is requesting knee injections.     Referral / Auth     Ophthalmology for glaucoma. Patient needs refills on her eye drops.        1. Diarrhea, unspecified type (Primary)  - long standing diarrhea. Previously had neg stool testing 3 years ago  - will do the following stool tests and refer to GI  - Bact Enteric Pathogen Panel PCR, Stool; Future  - Parasite Enteric Pathogen Panel; Future  - C.difficile PCR with Reflex to Toxin Antigen; Future  - Referral to Gastroenterology  - Comprehensive Metabolic Panel; Future  - CBC; Future  - TSH with reflex FT4, FT3; Future  - Calprotectin, Stool; Future  - HLA DQB1,DQA1 Celiac Panel; Future    2. Chronic pain of both knees  - possible patelofemoral syndrome  - completed xrays as previously ordered by Dr. Laurell  - start PT  - Referral to [facility], Physical Therapy    3. Primary open angle glaucoma of both eyes, unspecified glaucoma stage  4. Dry eyes  Lost follow up with ophthalmology , will refill ggt one time. Referred to ophthalmology  - prednisoLONE acetate 1% ophthalmic suspension; Place 2 drops into both eyes two (2) times daily.  Dispense: 5 mL; Refill: 0  - Referral to Ophthalmology  -  MIEBO 1.338 GM/ML SOLN; Place 1.338 mLs into both eyes four (4) times daily as needed.  Dispense: 3 mL; Refill: 0    5. Balance problem  - Referral to [facility] Physical Therapy          RHM (to be addressed at annual wellness visit)   Health Maintenance   Topic Date Due    Pneumococcal Vaccine (1 of 1 - PCV) Never done    Shingles (Shingrix) Vaccine (1 of 2) Never done    Advance Directive  Never done    Breast Cancer Screening: Mammogram  12/06/2021    COVID-19 Vaccine(Tracks primary and booster doses, not sup/immunocomp) (3 - 2024-25 season) 09/07/2022    Colorectal Cancer Screening  10/29/2022    Preventive Wellness Visit  10/30/2022    Influenza Vaccine (Season Ended) 09/07/2023    Tdap/Td Vaccine (2 - Td or Tdap) 06/11/2027    Hepatitis B Screening  Completed    Osteoporosis Early Detection DEXA Scan  Completed    Hepatitis C Screening  Completed    Statin prescribed for ASCVD Prevention or Treatment  Completed       Vaccines:  Immunization History   Administered Date(s) Administered    COVID-19, mRNA, (Pfizer - Purple Cap) 30 mcg/0.3 mL 05/07/2019, 05/28/2019    Hepatitis B, unspecified formulation 07/05/2004    Td 04/14/2007    Tdap 06/10/2017         The above plan of care, diagnosis, orders, and follow-up were discussed with the patient.  Questions related to this recommended plan of care were answered.        No follow-ups on file. or sooner PRN    Future Appointments   Date Time Provider Department Center   07/01/2023  1:20 PM EIC M[facility]01 MAM [facility]   08/07/2023  4:15 PM Cipriano Pama Emi LOISE., MD CARD [address]         Signed,  Regan Burkitt, DO, MS  [facility] Clinical Assistant Professor, Family Medicine  Department of Medicine  [place] [facility] [place] , [place]

## 2023-06-23 ENCOUNTER — Telehealth: Payer: PRIVATE HEALTH INSURANCE

## 2023-06-23 MED ORDER — MIEBO 1.338 GM/ML OP SOLN
1.338 mL | Freq: Four times a day (QID) | OPHTHALMIC | 0 refills | 10.00000 days | Status: AC | PRN
Start: 2023-06-23 — End: ?

## 2023-06-23 NOTE — Telephone Encounter
 Per pharmacy please clarify directions? Rossy MA

## 2023-06-23 NOTE — Telephone Encounter
 Called spoke to pharmacist directions given. Per pharmacist prescription is 250$ for a 30 day supply.   Patient has been using  LUMIGAN? 0.01%. she would like to know if you would like to prescribe that? Rossy ma

## 2023-06-23 NOTE — Telephone Encounter
 Medication Verification      Medication: MIEBO 1.338 GM/ML SOLN     Caller would like to verify the following (Please check all that applies):    []  Dosage    []  Quantity      [x]  Directions     Additional information?      Patient or caller has been notified of the turnaround time of 1-2 business day(s).

## 2023-07-01 ENCOUNTER — Ambulatory Visit: Payer: PRIVATE HEALTH INSURANCE

## 2023-07-02 NOTE — Telephone Encounter
 Called spoke to patient message was given regarding eye drops. Patient will be calling opth to schedule. Rossy MA

## 2023-07-06 ENCOUNTER — Telehealth: Payer: PRIVATE HEALTH INSURANCE

## 2023-07-06 NOTE — Telephone Encounter
 Pt called to schedule an appt. I informed pt that the referral was still pending. Pt was upset and disconnected the call.

## 2023-07-17 ENCOUNTER — Ambulatory Visit: Payer: PRIVATE HEALTH INSURANCE | Attending: Student in an Organized Health Care Education/Training Program

## 2023-07-17 DIAGNOSIS — I2510 Atherosclerotic heart disease of native coronary artery without angina pectoris: Secondary | ICD-10-CM

## 2023-07-17 DIAGNOSIS — H401130 Primary open-angle glaucoma, bilateral, stage unspecified: Principal | ICD-10-CM

## 2023-07-17 DIAGNOSIS — R195 Other fecal abnormalities: Secondary | ICD-10-CM

## 2023-07-17 MED ORDER — PREDNISOLONE ACETATE 1 % OP SUSP
2 [drp] | Freq: Two times a day (BID) | OPHTHALMIC | 0 refills | 60.00000 days | Status: AC
Start: 2023-07-17 — End: ?

## 2023-07-17 MED ORDER — MIEBO 1.338 GM/ML OP SOLN
1.338 mL | Freq: Four times a day (QID) | OPHTHALMIC | 0 refills | 7.00000 days | Status: AC | PRN
Start: 2023-07-17 — End: 2023-07-25

## 2023-07-17 NOTE — Progress Notes
 [facility]  Outpatient Telemedicine Follow Up Note    Patient Consent to Telehealth Questionnaire        No data to display              - I agree  to be treated via a video visit and acknowledge that I may be liable for any relevant copays or coinsurance depending on my insurance plan.  - I understand that this video visit is offered for my convenience and I am able to cancel and reschedule for an in-person appointment if I desire.  - I also acknowledge that sensitive medical information may be discussed during this video visit appointment and that it is my responsibility to locate myself in a location that ensures privacy to my own level of comfort.  - I also acknowledge that I should not be participating in a video visit in a way that could cause danger to myself or to those around me (such as driving or walking).  If my provider is concerned about my safety, I understand that they have the right to terminate the visit.     40 minutes were spent personally by me today on this encounter which include today's pre-visit review of the chart, obtaining appropriate history, performing an evaluation, documentation and discussion of management with details supported within the note for today's visit. The time documented was exclusive of any time spent on the separately billed procedure.    PMD: Kajani, Surina R., MD, MS  07/17/2023      Chief Complaint   Patient presents with    Eye Pain     Continue to have eye pain and prescription refill.           SUBJECTIVE     Nina Cox is a 73 y.o. female who presents for     Follow up bilateral eye discomfort/pain and dry eyes  Bilateral open-angle glaucoma  Bilateral macular degeneration  - Experiencing light sensitivity bilaterally, right appears worse than left, endorses blurry vision without double vision, some peripheral vision loss  - Endorses worse symptoms on right eye compared to left  - Denies fevers, chills, eye discharge, or pain with eye movement  - has been using Miebo  1.338 mm/mL and prednisolone  1% ophthalmic suspension for at least 2-3 years prescribed by ophthalmologist in [facility], however desires to follow up at [facility], was pending insurance authorization; informed patient that ophthalmology appears authorized upon my check today  - Also reports moving houses, and misplaced her eyedrops, requested refills    Positive FIT testing  - Noted abnormal/positive fit from 10/28/2021, and was noted, however patient appears in unaware of such, but does have GI follow up on 08/13/2023, however patient was not aware of such, thus informed  - Reports that she did not have a colonoscopy since last check    Medication review  - Reviewed medications line by line with patient, including medications prescribed by cardiologist Dr. Peyman Azadani   - Thought that she needed to discontinue aspirin  81 mg for history of ASCVD as it was part of her combination medication; however was in fact supposed to stop single pill hydrochlorothiazide  12.5 mg in favor of losartan -hydrochlorothiazide  100-25 mg daily; clarified such    Review of Systems - A complete 14-system review of system was performed with pertinent positives and negatives included above and in the HPI and otherwise reviewed and found to be negative and/or non-contributory.      Past Medical History:  Diagnosis Date    Glaucoma     Hypertension      Past Surgical History:   Procedure Laterality Date    BREAST SURGERY         Medications: reviewed medication list in the chart    Medications that the patient states to be currently taking   Medication Sig    ALBUTEROL  90 mcg/act inhaler INHALE 1 PUFF BY MOUTH EVERY 4 HOURS AS NEEDED FOR SHORTNESS OF BREATH OR WHEEZING    losartan -hydroCHLOROthiazide  100-25 mg tablet Take 1 tablet by mouth daily.    MIEBO  1.338 GM/ML SOLN Place 1.338 mLs into both eyes four (4) times daily as needed.    prednisoLONE  acetate 1% ophthalmic suspension Place 2 drops into both eyes two (2) times daily.    valACYclovir  1000 mg tablet Take 2 tablets (2,000 mg total) by mouth two (2) times daily as needed (for 1 day per episode).       Allergies: Amlodipine        OBJECTIVE     LMP  (LMP Unknown)     Physical Exam: limited by televisit  General - alert, well appearing, and in no distress  Eyes - extraocular eye movements intact, conjunctivae not injected  Respiratory - speaking in full sentences, no cough audible  Neurological: alert, oriented to person, place, time, and situation, normal speech  Psych - normal mood, speech unpressured, affect appropriate    Labs:  Results for orders placed or performed in visit on 06/03/23   Echo adult transthoracic complete   Result Value Ref Range    Left Ventricular Ejection Fraction 61 %         Imaging:  No results found.        ASSESSMENT AND PLAN:      Nina Cox is a 73 y.o. female who presents for   Chief Complaint   Patient presents with    Eye Pain     Continue to have eye pain and prescription refill.         1. Primary open angle glaucoma of both eyes, unspecified glaucoma stage (Primary)  Chronic; ongoing  Likely experiencing symptoms of glaucoma, dry eyes, and although patient reports having macular degeneration, although describes peripheral vision loss rather than central does not seem very consistent, warrants specialist's evaluation  - Refilled home dose of MIEBO  and prednisolone  for now until further evaluation by Ophthalmology, encourage patient to call to schedule with Ophthalmology right after completion of today's visit  - MIEBO  1.338 GM/ML SOLN; Place 1.338 mLs into both eyes four (4) times daily as needed.  Dispense: 3 mL; Refill: 0  - prednisoLONE  acetate 1% ophthalmic suspension; Place 2 drops into both eyes two (2) times daily.  Dispense: 5 mL; Refill: 0    2. Positive FIT (fecal immunochemical test)  History of such, does not appear followed up by colonoscopy  - Extensive discuss results in which 10/28/2021 FIT stool testing returned positive meaning a diagnostic colonoscopy procedure is recommended to better evaluate for colon cancer, and appears to be scheduled with GI Dr. Tanda Drum on 08/13/2023; will message GI regarding such    3. Atherosclerosis of native coronary artery of native heart without angina pectoris  History of such  Appears to have inadvertently/mistakenly discontinued ASA 81 mg recommended by Cardiology due to thought that it might be included in the combination blood pressure medication, inform her that was not the case and recommended restarting ASA 81 mg  Also reports that  she is taking atorvastatin  10 mg at nighttime, offered to refill, however reports having ample supply    RHM (to be addressed at annual wellness visit)   Health Maintenance   Topic Date Due    Pneumococcal Vaccine (1 of 1 - PCV) Never done    Shingles (Shingrix) Vaccine (1 of 2) Never done    Advance Directive  Never done    Breast Cancer Screening: Mammogram  12/06/2021    COVID-19 Vaccine(Tracks primary and booster doses, not sup/immunocomp) (3 - 2024-25 season) 09/07/2022    Colorectal Cancer Screening  10/29/2022    Preventive Wellness Visit  10/30/2022    Statin prescribed for ASCVD Prevention or Treatment  Never done    Influenza Vaccine (1) 09/07/2023    Tdap/Td Vaccine (2 - Td or Tdap) 06/11/2027    Hepatitis B Screening  Completed    Osteoporosis Early Detection DEXA Scan  Completed    Hepatitis C Screening  Completed       Vaccines:  Immunization History   Administered Date(s) Administered    COVID-19, mRNA, (Pfizer - Purple Cap) 30 mcg/0.3 mL 05/07/2019, 05/28/2019    Hepatitis B, unspecified formulation 07/05/2004    Td 04/14/2007    Tdap 06/10/2017         The above plan of care, diagnosis, orders, and follow-up were discussed with the patient.  Questions related to this recommended plan of care were answered.    No follow-ups on file. or sooner PRN    Future Appointments   Date Time Provider Department Center   08/07/2023 4:15 PM Cipriano Pama Emi LOISE., MD CARD [facility]   08/13/2023  4:00 PM Gracie Tanda DEL., MD GSTRO [address]         Signed,  Beola T. Laurell, MD   [facility] Assistant Professor  Department of Medicine  [place] [facility] [place] , [place]  9:43 [place] 07/17/2023

## 2023-07-19 ENCOUNTER — Telehealth: Payer: PRIVATE HEALTH INSURANCE

## 2023-07-20 NOTE — Telephone Encounter
 Spoke to patient and confirm new appointment for 07/23/23. No further action require.

## 2023-07-21 ENCOUNTER — Ambulatory Visit: Payer: PRIVATE HEALTH INSURANCE

## 2023-07-21 DIAGNOSIS — R197 Diarrhea, unspecified: Secondary | ICD-10-CM

## 2023-07-22 ENCOUNTER — Telehealth: Payer: PRIVATE HEALTH INSURANCE

## 2023-07-22 ENCOUNTER — Ambulatory Visit: Payer: PRIVATE HEALTH INSURANCE

## 2023-07-22 DIAGNOSIS — R197 Diarrhea, unspecified: Principal | ICD-10-CM

## 2023-07-22 NOTE — Telephone Encounter
 Medication Verification      Medication: MIEBO 1.338 GM/ML SOLN     Caller would like to verify the following (Please check all that applies):    []  Dosage    []  Quantity      [x]  Directions     Additional information?      Patient or caller has been notified of the turnaround time of 1-2 business day(s).

## 2023-07-23 ENCOUNTER — Telehealth: Payer: PRIVATE HEALTH INSURANCE

## 2023-07-23 ENCOUNTER — Ambulatory Visit: Payer: PRIVATE HEALTH INSURANCE

## 2023-07-23 DIAGNOSIS — K529 Noninfective gastroenteritis and colitis, unspecified: Secondary | ICD-10-CM

## 2023-07-23 DIAGNOSIS — F109 Heavy alcohol use: Principal | ICD-10-CM

## 2023-07-23 DIAGNOSIS — R195 Other fecal abnormalities: Secondary | ICD-10-CM

## 2023-07-23 LAB — Calprotectin, Stool: CALPROTECTIN, STL: 16.5 g/g (ref <50)

## 2023-07-23 LAB — C difficile PCR: C DIFFICILE PCR: NEGATIVE

## 2023-07-23 LAB — Parasite Enteric Pathogen Panel: ENTAMOEBA HISTOLYTICA PCR: NOT DETECTED

## 2023-07-23 MED ORDER — NA SULFATE-K SULFATE-MG SULF 17.5-3.13-1.6 GM/177ML PO SOLN
TOPICAL | 0 refills | 30.00000 days | Status: AC
Start: 2023-07-23 — End: ?

## 2023-07-23 NOTE — Consults
 [facility], [facility]  [address], [address]  [place], [place] [address]  Phone: [phone]  Fax: [phone]     Outpatient Gastroenterology Consult Note                                     PATIENT: Nina Cox  MRN: [id number]  DOB: July 17, 1950  DATE OF SERVICE: 07/23/2023    REFERRING PRACTITIONER: Zelimkhanian, Biayna, D*  PRIMARY CARE PROVIDER: Ranee Terril SAUNDERS., MD, MS    Patient Active Problem List   Diagnosis    Essential hypertension    Hyperlipidemia    Prediabetes    Nevus of choroid    Insufficiency, tear film    Chronic nonalcoholic liver disease    Primary open angle glaucoma (POAG) of both eyes    Hardening of the aorta (main artery of the heart) (HCC/RAF)    Abnormal tomography of chest    Atherosclerosis of native coronary artery of native heart    Aortic regurgitation    Noncompliance with medication regimen       Chief Complaint:   Chief Complaint   Patient presents with    Diarrhea     Patient says its happen around 2 years till now   FIT  Positive    HPI:     Nina Cox is a 73 y.o. female with hypertension presents for initial consultation for the above chief complaint    07/23/2023  6-8xday bm. Loose stools. Occ nocturnal sx. Ongoing for last 10yrs.   She was fit positive in 2023 and has not scheduled colonoscopy. No previous colonoscopy    No GERD, dysphagia, odynophagia, unintended weight loss, abdpain, brbpr, melena, f/c/n/v, NSAIDs.    Past Medical History:  Past Medical History:   Diagnosis Date    Glaucoma     Hypertension         Past Surgical History:  Past Surgical History:   Procedure Laterality Date    BREAST SURGERY         Allergies:  Allergies   Allergen Reactions    Amlodipine      Other reaction(s): Other  Edema        Current Meds Reviewed:  Current Outpatient Medications   Medication Sig    ALBUTEROL  90 mcg/act inhaler INHALE 1 PUFF BY MOUTH EVERY 4 HOURS AS NEEDED FOR SHORTNESS OF BREATH OR WHEEZING    aspirin  (ASPIRIN  LOW DOSE) 81 mg EC tablet TAKE 1 TABLET(81 MG) BY MOUTH DAILY    atorvastatin  10 mg tablet Take 1 tablet (10 mg total) by mouth daily.    losartan -hydroCHLOROthiazide  100-25 mg tablet Take 1 tablet by mouth daily.    MIEBO  1.338 GM/ML SOLN Place 1.338 mLs into both eyes four (4) times daily as needed.    prednisoLONE  acetate 1% ophthalmic suspension Place 2 drops into both eyes two (2) times daily.    valACYclovir  1000 mg tablet Take 2 tablets (2,000 mg total) by mouth two (2) times daily as needed (for 1 day per episode).    [DISCONTINUED] doxycycline hyclate 20 mg tablet Take 1 tablet (20 mg total) by mouth two (2) times daily. (Patient not taking: Reported on 07/17/2023)    [DISCONTINUED] hydroCHLOROthiazide  12.5 MG tablet Take 1 tablet (12.5 mg total) by mouth daily. (Patient not taking: Reported on 07/17/2023)    [DISCONTINUED] MIEBO  1.338 GM/ML SOLN Place 1.338 mLs into both eyes four (4)  times daily as needed.    [DISCONTINUED] prednisoLONE  acetate 1% ophthalmic suspension Place 2 drops into both eyes two (2) times daily.     No current facility-administered medications for this visit.        Social History:  Social History     Socioeconomic History    Marital status: Divorced   Tobacco Use    Smoking status: Never    Smokeless tobacco: Never   Vaping Use    Vaping status: Never Used   Substance and Sexual Activity    Alcohol use: Yes    Drug use: No    Sexual activity: Not Currently   Other Topics Concern    1. Need Help Feeding Yourself? No    2. Need Help Getting Dressed? No    3. Need Help Using the Telephone? No    4. 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 Need Help Shopping for Groceries? No    6. Need Help Getting Places Beyond Walking Distance? (Bus, Taxi) No    7. Need Help Getting from Bed to Chair? No    8. Need Help Bathing or Showering? No    9. Need Help Taking your Medications? No    10.  Need Help Doing Moderately Strenuous Housework? (ex. Laundry) No    11. Need Help Driving? No    12. Need Help Getting to the Toilet? No    13. Need Help Walking Across the Road? (Includes [place], [place]) No    14. Need Help Preparing Meals? No    15. Need Help Shopping for Personal Items? (Toiletries, Medicines) No    16. Need Help Climbing a Flight of Stairs? No    17. Do you live with someone who assists you at home? No    18. Do you get help from family members or friends in your home? No    19. Do you employ someone to provide health related care or help you in your home? No    20. Do you provide care for a family member? No    21. Does your home have rugs in the hallway? No    22. Does your home have poor lighting? No    23. Does your home lack grab bars in the bathroom? No    24. Does your home lack handrails on the stairs? Yes    25. Have you noticed any hearing difficulties? No    26. Do you currently participate in any regular activity to improve or maintain your physical fitness? Yes    27. Do you always wear a seatbelt when you ride in a car? Yes    28. If you drink alcohol, do you drink more than 7 drinks per week or more than 3 drinks on any given day? No    29. Has anyone ever been concerned about your drinking? No    Do you exercise at least a day, 3 or more days a week? Yes    Types of Exercise? (List in Comments) No    Do you follow a special diet? No    Vegan? No    Vegetarian? No    Pescatarian? No    Lactose Free? No    Gluten Free? No    Omnivore? 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            Constitutional: Well developed, well nourished, alert, cooperative, and in no acute distress   Head: Normocephalic, without obvious abnormality, atraumatic   Eyes: Anicteric sclera.     Neck: Symmetric, Range of motion intact   Lungs: Equal rise, breathing comfortably on room air   Skin: Skin color, texture, turgor normal. No rashes or lesions   Neurological: Alert, normal affect    Labs Reviewed:  Lab Results   Component Value Date    NA 140 06/03/2023    K 3.9 06/03/2023    CL 107 (H) 06/03/2023    CO2 19 (L) 06/03/2023    BUN 14 06/03/2023    CREAT 0.54 (L) 06/03/2023    CALCIUM  10.1 06/03/2023     Lab Results   Component Value Date    WBC 6.93 10/23/2021    HGB 17.4 (H) 10/23/2021    HCT 49.7 (H) 10/23/2021    MCV 94.1 10/23/2021    PLT 181 10/23/2021    INR 1.0 10/23/2021    APTT 32.3 10/23/2021     Lab Results   Component Value Date    AST 44 11/27/2022    ALT 30 11/27/2022    ALKPHOS 57 11/27/2022    BILITOT 0.5 11/27/2022    ALBUMIN 4.5 11/27/2022     No results found for: ''SRWEST'', ''CRP''  No results found for: ''IRON'', ''TIBC'', ''FERRITIN''    Imaging Reviewed:      GI Studies Reviewed:      MDM  Number and Complexity of Problems Addressed at the Encounter:   []   1 or more chronic illness with exacerbation, progression, or side effects of treatment  []   2 or more stable chronic illness  []   1 undiagnosed new problem with uncertain diagnosis  []   1 acute illness with systemic symptoms  []   1 acute complicated injury     Review of Data: I have  []  Reviewed/ordered >= 3 unique laboratory, radiology, and/or diagnostic tests noted    []  Reviewed prior external notes and incorporated into patient assessment as noted  []  I have independently interpreted test performed by other physician(s) as noted   []  Discussed management or test interpretation with external provider(s) as noted     Risk of Complication and or Morbidity or Mortality of Patient Management including Social Determinants of Health:   []   I deem the above diagnoses to have a risk of complication, morbidity or mortality of moderate   []  The diagnosis or treatment of said conditions is significantly limited by social determinants of health as noted.     Assessment/Plan:   CAFFIE SOTTO is a 73 y.o. female with hypertension presents for initial consultation for the above chief complaint    #Diarrhea initial stool tests negative  -check fecal elastase, celiac  -colonoscopy with R and L colon biopsies to evaluate for microscopic colitis    #FIT positive.  -colonoscopy.  Offered to schedule her expedited next Wednesday.  She will check her schedule and let us  know.  Also told her she can call the number for  [facility] to schedule.  Discussed need for expedited exam    #heavy alcohol use.  -avoid heavy alcohol  -ultrasound with elastography    Colonoscopy discussed in detail with the patient. Risks, benefits and alternatives were also discussed. Risks include, but are not limited to, bleeding, perforation, adverse medication/sedation reaction, and missing lesions including malignancy. We discussed the need for a driver due to sedation. All questions were answered and the patient agreed to proceed.    Return to clinic after endoscopy    Thank you for allowing me the opportunity to participate in the care of your patient.  Please do not hesitate to call me with any questions.      Author: Tanda Levi Drum, MD 07/23/2023 2:33 PM    Portions of this note may have been created with voice recognition software. Occassional wrong-word or ''sound-alike'' substitutions may have occurred due to the inherent limitations of voice recognition software. Please read the chart carefully and recognize, using context, where these substitutions have occurred. The above plan/recommendation(s) were discussed with the patient. The patient had all questions answered satisfactorily and is in agreement with this recommended plan of care.

## 2023-07-23 NOTE — Patient Instructions
 Please call our Patient Communication Center [facility]) at [phone] to schedule your procedure

## 2023-07-23 NOTE — Telephone Encounter
 IN: 2023-07-20 10:21 am [facility]   Call Type: All Calls   Name: Jochebed Bills   Caller ID: [id number]   Is CID the best callback number? CINDERELLA Benjaman Rushing Number: [phone]   SELECT ''Y'' if caller states they have contacted before for the same issue/matter    (Repeat caller?) Y/N: N   Date of Birth:  [birth year]   Medical Record Number (MRN),if any:    Type of Inquiry: Other Inquiry/Matter   Reason for Call:  Upset caller. Caller is looking to schedule an appointment.    >>>>>>>>>> END OF FORM <<<<<<<<<<   >>>>>>>>>> END OF ALL FORMS <<<<<<<<<<   Left message to call us  back.

## 2023-07-23 NOTE — Telephone Encounter
 Provider: Tanda Drum  Procedure: Suprep  Sedation: MAC  Location: [place]  Date/Time: TBD    Reviewed prep instructions with patient.   Please assist with scheduling  Patient aware scheduling department will be contacting them within 3 business days.   If no phone call is received patient aware to contact scheduling department.    Thank you,  Will Jo MA [place]

## 2023-07-24 ENCOUNTER — Telehealth: Payer: PRIVATE HEALTH INSURANCE

## 2023-07-24 DIAGNOSIS — H401130 Primary open-angle glaucoma, bilateral, stage unspecified: Secondary | ICD-10-CM

## 2023-07-24 DIAGNOSIS — H04123 Dry eye syndrome of bilateral lacrimal glands: Principal | ICD-10-CM

## 2023-07-24 LAB — Bacterial Enteric Pathogen Panel: SHIGELLA SPECIES: NOT DETECTED

## 2023-07-24 MED ORDER — MIEBO 1.338 GM/ML OP SOLN
1 [drp] | Freq: Four times a day (QID) | OPHTHALMIC | 0 refills | 7.00000 days | Status: AC | PRN
Start: 2023-07-24 — End: ?

## 2023-07-24 NOTE — Telephone Encounter
-----   Message from Tanda HILARIO Drum, MD sent at 07/23/2023  3:00 PM PDT -----     Please schedule with Dr. Drum at [facility]. Thanks

## 2023-07-24 NOTE — Telephone Encounter
 Left VM to assist in scheduling colon procedure at the [facility] with Dr.Omino. Provided clinic phone number and also the scheduling # [phone] in case I am not available.

## 2023-07-24 NOTE — Telephone Encounter
 Contacted [facility], per pharmacists; please verify how many drops pt will need to apply.   1 drop= 0.05 mL . Please send a new rx with new directions.

## 2023-07-28 ENCOUNTER — Inpatient Hospital Stay: Payer: PRIVATE HEALTH INSURANCE

## 2023-07-28 LAB — Pancreatic Elastase, Fecal by Immunoassay: PANCREATIC ELASTASE-1, STOOL: 579 ug/g (ref >=100)

## 2023-08-02 DIAGNOSIS — I351 Nonrheumatic aortic (valve) insufficiency: Principal | ICD-10-CM

## 2023-08-02 DIAGNOSIS — R053 Chronic cough: Secondary | ICD-10-CM

## 2023-08-02 DIAGNOSIS — K529 Noninfective gastroenteritis and colitis, unspecified: Secondary | ICD-10-CM

## 2023-08-02 DIAGNOSIS — I2510 Atherosclerotic heart disease of native coronary artery without angina pectoris: Secondary | ICD-10-CM

## 2023-08-02 NOTE — Progress Notes
 PRIMARY CARE PHYSICIAN: No primary care provider on file.    OUTPATIENT CARDIOLOGY FOLLOW UP PROGRESS NOTE    Subjective: Nina Cox is a pleasant 73 year old female with history of coronary artery disease, uncontrolled hypertension and dyslipidemia presents to Cardiology Clinic for follow up visit.  During the last visit I increased her losartan  to 100 mg along with the hydrochlorothiazide  25 mg daily.  Her blood pressure is well controlled now. She has been under stress daily.  Prior she had episode of chest discomfort.  These episodes are not necessarily exertional and lasts for a few minutes.  As a result we got a coronary CT angiogram which showed nonobstructive CAD detailed below.   I also put her on atorvastatin  and aspirin  but she has not not been taking her atorvastatin  regularly. She denies having any shortness of breath, palpitations, orthopnea, presyncope or syncope.     Review of Systems:  Gen: no fevers, chills, or night sweats.  HEENT: no acute visual or hearing changes; no new oral lesions or excess secretions.  Positive for chronic cough  CV: no chest pain, orthopnea, PND, palpitations, or syncope.  Resp: no SOB, DOE, cough, or hemoptysis.  GI: no constipation, hematochezia, or melena.  Positive for chronic diarrhea  GU: no dysuria, urgency, or hematuria.  Musc: no acute joint swelling or pain.  Skin: no acute rashes.  Neuro: no acute numbness, weakness, or seizures.  Psych: no anxiety or depression.  Endocrine: no heat or cold intolerance.  Heme/Lymph: no spontaneous bruising, bleeding, or lymph node swelling.  Allergic/Immunologic: no acute allergic reactions or hives.    Past Medical History:   Diagnosis Date    Glaucoma     Hypertension        Past Surgical History:   Procedure Laterality Date    BREAST SURGERY         Family History   Problem Relation Age of Onset    Coronary artery disease Father        Social History     Tobacco Use    Smoking status: Never    Smokeless tobacco: Never Vaping Use    Vaping status: Never Used   Substance Use Topics    Alcohol use: Yes    Drug use: No       Allergies   Allergen Reactions    Amlodipine      Other reaction(s): Other  Edema      Outpatient Medications Prior to Visit   Medication Sig    ALBUTEROL  90 mcg/act inhaler INHALE 1 PUFF BY MOUTH EVERY 4 HOURS AS NEEDED FOR SHORTNESS OF BREATH OR WHEEZING    aspirin  (ASPIRIN  LOW DOSE) 81 mg EC tablet TAKE 1 TABLET(81 MG) BY MOUTH DAILY    atorvastatin  10 mg tablet Take 1 tablet (10 mg total) by mouth daily.    losartan -hydroCHLOROthiazide  100-25 mg tablet Take 1 tablet by mouth daily.    MIEBO  1.338 GM/ML SOLN Place 1 drop into both eyes four (4) times daily as needed (Dry eyes).    prednisoLONE  acetate 1% ophthalmic suspension Place 2 drops into both eyes two (2) times daily.    sodium sulfate-potassium sulfate-magnesium sulfate solution Take as directed    valACYclovir  1000 mg tablet Take 2 tablets (2,000 mg total) by mouth two (2) times daily as needed (for 1 day per episode).     No facility-administered medications prior to visit.       EXAM:  BP 131/70  ~ Pulse 76  ~  Resp 16  ~ Ht 5' (1.524 m)  ~ LMP  (LMP Unknown)  ~ SpO2 95%  ~ BMI 23.44 kg/m?       11/28/2022     4:32 PM 01/28/2023     3:48 PM 06/03/2023     4:06 PM 06/22/2023     4:28 PM 06/22/2023     4:29 PM 07/23/2023     2:27 PM 08/07/2023     4:13 PM   Vitals - 1 value per visit   SYSTOLIC 159 153 819 165 164 121 131   DIASTOLIC 82 74 66 57 70 54 70   Heart Rate 83 76 67 62 66 64 76   Temp    36.7 ?C (98 ?F)      Resp 15   17   16    Weight (lb) -- -- -- 120  120 --   Height  5' (1.524 m) 5' (1.524 m)   5' (1.524 m) 5' (1.524 m)   BMI  24.22 kg/m2 24.22 kg/m2 23.44 kg/m2  23.44 kg/m2 23.44 kg/m2   SpO2 96 % 95 % 97 % 96 %  97 % 95 %   Visit Report Report Report Report Report Report Report Report       GEN: awake and alert x3, no acute distress. Able to converse.  HEENT: PERRL, EOMI,moist conjunctivae, oral cavity clear; ears and nose atraumatic.   NECK: No significant JVD; +2 carotids bilaterally with no significant bruits.   HEART: RRR with normal S1 and S2, unchanged II/VI systolic and diastolic murmur noted  LUNGS: CTAB bilaterally with no rales or wheezes. Normal respiratory effort.  ABD: soft, NT, ND, +BS.   EXT: non-tender with no edema   MUSC: no joint swelling or effusions with normal range of movement of upper and lower extremities.  SKIN: no acute rashes.    ASCVD Risk Estimation  16.9% is the estimated 10-year risk of atherosclerotic cardiovascular disease (ASCVD) as of 4:23 PM on 08/07/2023  Values used to calculate ASCVD score:  Age: 73 y.o.    Gender: Female Race: Not African American.  64 mg/dL. (measured on 11/27/2022)  147 mg/dL. (measured on 11/27/2022)  131 mm Hg. (measured on 08/07/2023)  Yes  currently not a smoker  No  Click here for the 2013 ACC/AHA Cholesterol Treatment Guideline Summary (PDF).  Click here for the 2013 ACC/AHA Cardiovascular Risk Estimator tool Office manager).    Lab Results   Component Value Date    WBC 6.93 10/23/2021    HGB 17.4 (H) 10/23/2021    HCT 49.7 (H) 10/23/2021    MCV 94.1 10/23/2021    PLT 181 10/23/2021     Lab Results   Component Value Date    CREAT 0.54 (L) 06/03/2023    BUN 14 06/03/2023    NA 140 06/03/2023    K 3.9 06/03/2023    CL 107 (H) 06/03/2023    CO2 19 (L) 06/03/2023     Lab Results   Component Value Date    HGBA1C 5.4 10/23/2021     Lab Results   Component Value Date    ALT 30 11/27/2022    AST 44 11/27/2022    ALKPHOS 57 11/27/2022    BILITOT 0.5 11/27/2022     Lab Results   Component Value Date    TSH 1.2 10/23/2021     Lab Results   Component Value Date    CALCIUM  10.1 06/03/2023     Lab Results  Component Value Date    CHOL 147 11/27/2022    CHOLHDL 64 11/27/2022    CHOLDLCAL 66 11/27/2022    TRIGLY 84 11/27/2022     Chest CT 08/24/2020: personally reviewed and interpreted, calcified plaques seen in the aorta and left main/ostial LAD      ECG 10/22/2021:  personally reviewed and interpreted. NSR, VR 69 bpm, PR interval 176 ms, QRS duration 80 ms, QTc 441 ms.      Coronary CTA 02/26/2022: personally reviewed and interpreted.      IMPRESSION: Good-quality CT coronary angiogram with no non evaluable segments.  Calcified plaque at the proximal LAD resulting in mild (under 50%) stenosis. Otherwise, the coronary artery anatomy is conventional without high-grade stenosis.  Peripherally calcified bilateral breast prostheses. Unchanged ovoid soft tissue nodule posterior the left silicone implant with internal calcifications. While this finding is unchanged from 2022 prior, consider follow-up with dedicated breast imaging for further evaluation.     Echocardiogram 03/14/2022: personally reviewed and interpreted.    CONCLUSIONS   1. Normal left ventricular size, wall thickness and function (EF 70%).   2. Normal right ventricular size and systolic function.   3. Mild aortic regurgitation.   4. There are no prior studies on this patient for comparison purposes.    ECG 01/28/2023: personally reviewed and interpreted. NSR with LAFB, can not rule out anterior infarct VR 85 bpm, PR interval 162 ms, QRS duration 84 ms, QTc 447 ms.    Echocardiogram 06/03/2023:  personally reviewed and interpreted.   CONCLUSIONS   1. Normal left ventricular size and function (EF 61%).   2. Mild concentric left ventricular hypertrophy.   3. Normal right ventricular size and systolic function.   4. Trivial aortic regurgitation otherwise no significant valvular abnormalities..   5. PA systolic pressure cannot be calculated due to faint TR jet.   6. Compared to prior study on 03/14/2022, mild LVH is not present otherwise no significant changes.      ASSESSMENT: Nina Cox is a pleasant 73 year old female with history of coronary artery disease, uncontrolled hypertension and dyslipidemia presents to Cardiology Clinic for follow up visit.      PLAN:  #Hypertension:  Now well controlled, I rechecked her blood pressure after a few minutes of resting and was still elevated.  Now mild LVH on echocardiogram  - continue losartan  to 100 mg daily, this was increased during the last visit,  - continue hydrochlorothiazide  25 mg daily  - continue home blood pressure monitoring instructed  - will check a basic metabolic panel to monitor kidney function and electrolytes    #Coronary artery disease:  Seen on chest CT, nonobstructive  - continue aspirin  81 mg daily, and she can take it 3 times a week with any bruising  - continue atorvastatin  10 mg q.h.s regularly.  A refill was sent to her pharmacy    #Dyslipidemia: LDL 79 -> 70 -> 66  - agreed to take atorvastatin  10 mg q.h.s. regularly, a refill was sent to her pharmacy  - will continue to monitor    #Aortic Regurgitation:  New diagnosis.  Discussed with the patient in detail  - will make sure her blood pressure is well controlled and will repeat her echocardiogram to monitor prior to her next visit    #Chronic cough:  Nonproductive  - not on ACE inhibitors  - PFTs were normal in 2023  - she has been sent back to Pulmonary for further evaluation  - she was advised  to try antacid medication as a trial    #Follow up:  6 months from now before her move to [place]     Thank you for the opportunity to continue to participate in your patient's care. 46 minutes were spent personally by me today on this encounter which include today's pre-visit review of chart, obtaining appropriate history, performing an evaluation, documentation and discussion of management with details supported within the note for today's visit. The time documented was exclusive of any time spent of the separately billed procedure.         Emi Schiller, MD  [employer]  Dept. of Medicine, Div. of Cardiology  [facility]  [address],   [place], [place]  [address]

## 2023-08-07 ENCOUNTER — Ambulatory Visit: Payer: PRIVATE HEALTH INSURANCE | Attending: Cardiovascular Disease

## 2023-08-07 MED ORDER — ASPIRIN 81 MG PO TBEC
81 mg | ORAL_TABLET | Freq: Every day | ORAL | 3 refills | 36.00000 days | Status: AC
Start: 2023-08-07 — End: ?

## 2023-08-07 MED ORDER — ATORVASTATIN CALCIUM 10 MG PO TABS
10 mg | ORAL_TABLET | Freq: Every evening | ORAL | 3 refills | 90.00000 days | Status: AC
Start: 2023-08-07 — End: ?

## 2023-08-12 ENCOUNTER — Telehealth: Payer: PRIVATE HEALTH INSURANCE

## 2023-08-12 ENCOUNTER — Other Ambulatory Visit: Payer: PRIVATE HEALTH INSURANCE

## 2023-08-12 NOTE — Telephone Encounter
 Confirmation Documentation   Patient is scheduled on (DATE) for: 08/19/2023    [x]  Colonoscopy   []  Upper Endoscopy   []  Pouchoscopy   []  Upper Endoscopy w/Bravo   []  Upper Endoscopy w/ Esophageal Manometry   []  Upper Endoscopy w/ Esophageal Manometry & pH   []  Upper Endoscopy w/Endoflip   []  Sigmoidoscopy   []  Anoscopy   []  Illeoscopy   []  Small Bowel Enteroscopy   []  Esophageal Manometry   []  Anorectal Manometry   []  pH Study   []  Capsule Endoscopy    Informed patient of the following:   []  Arrival time   []  Location including suite number   []  Transportation   []  [employer]   []  Does patient have prep instructions?   []  Informed patient to call back should there be any questions?   [x]  Does procedure order match the case scheduled    NOTE: If patients have any questions regarding prescribed medication patient needs to contact their prescribing physician to ensure it is ok to stop medications.

## 2023-08-13 ENCOUNTER — Ambulatory Visit: Payer: PRIVATE HEALTH INSURANCE

## 2023-08-14 ENCOUNTER — Other Ambulatory Visit: Payer: PRIVATE HEALTH INSURANCE

## 2023-08-14 ENCOUNTER — Telehealth: Payer: PRIVATE HEALTH INSURANCE

## 2023-08-14 NOTE — Telephone Encounter
 LM for patient to remind and confirm procedure scheduled for 08/19/2023  Check-in time: 12:30 pm   Procedure time:1:30 pm   Advised patient to take any blood pressure, heart, anti-seizurals and inhaler medications that morning of the procedure. Patient advised if using an inhaler to bring to the procedure. Advised patient they must have a ride home from the procedure with someone that can accompany them home. Medical procedure staff must be able to confirm ride when they arrive or the procedure will be cancelled.    Advised to call back with any questions or concerns.

## 2023-08-17 ENCOUNTER — Other Ambulatory Visit: Payer: PRIVATE HEALTH INSURANCE

## 2023-08-17 ENCOUNTER — Inpatient Hospital Stay: Payer: PRIVATE HEALTH INSURANCE

## 2023-08-27 ENCOUNTER — Ambulatory Visit: Payer: PRIVATE HEALTH INSURANCE

## 2023-08-27 DIAGNOSIS — H40003 Preglaucoma, unspecified, bilateral: Secondary | ICD-10-CM

## 2023-08-27 DIAGNOSIS — H04123 Dry eye syndrome of bilateral lacrimal glands: Secondary | ICD-10-CM

## 2023-08-27 DIAGNOSIS — Z961 Presence of intraocular lens: Secondary | ICD-10-CM

## 2023-08-27 DIAGNOSIS — H401130 Primary open-angle glaucoma, bilateral, stage unspecified: Principal | ICD-10-CM

## 2023-08-27 DIAGNOSIS — D3132 Benign neoplasm of left choroid: Secondary | ICD-10-CM

## 2023-08-27 MED ORDER — CYCLOSPORINE 0.05 % OP EMUL
1 [drp] | Freq: Two times a day (BID) | OPHTHALMIC | 6 refills | Status: AC
Start: 2023-08-27 — End: ?

## 2023-08-27 MED ORDER — LATANOPROST 0.005 % OP SOLN
1 [drp] | Freq: Every evening | OPHTHALMIC | 6 refills | Status: AC
Start: 2023-08-27 — End: ?

## 2023-08-27 NOTE — Assessment & Plan Note
 Return precautions and ref flags involving but not limited to decreased vision, eye pain, red eye, flashing and floaters discussed with the patient. Verbalized understanding.

## 2023-08-27 NOTE — Assessment & Plan Note
 Has been on restasis  twice a day  Rn out of  Will restart and use artificial tears twice a day

## 2023-08-27 NOTE — Assessment & Plan Note
 Has been followed up at [facility] before  Has been on Latanoprost  at bedtime both eyes , ran out of the drops    Baseline oct nerve fiber layer thinning left eye   Continue present management  Follow up with intraocular pressure check and testing

## 2023-08-27 NOTE — Patient Instructions
 How the Eye Works  [place] vision depends on many factors. The parts of the eye work together to bend Designer, fashion/clothing) and focus light rays. For normal vision, light must focus onto the retina.      Cornea  Light enters the eye through this clear, dome-shaped tissue that covers the front of the eye. The cornea bends light rays to help focus them. Problems with its shape can affect vision.   Pupil  This circular window in the center of the [place] opens and closes to let the right amount of light into the eye.   Eisha  This is the colored part of the eye. It contains muscles that open (dilate) and close (constrict) the pupil.   Lens  This disk of clear tissue behind the pupil changes shape to help focus light.  Retina  This thin layer of light-sensitive tissue lines the inside back of the eye. It sends images as electrical impulses to the optic nerve.   Optic nerve  This nerve carries signals from the retina to the brain. The brain then interprets these signals to make images. These images are what you see.   [employer] last reviewed this educational content on 08/06/2020  ? 2000-2023 [employer], [place]. All rights reserved. This information is not intended as a substitute for professional medical care. Always follow your healthcare professional's instructions.

## 2023-08-27 NOTE — Assessment & Plan Note
 Done by outside provider. Overall doing well. Monitor.

## 2023-08-27 NOTE — Progress Notes
  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 Verbalized understanding.       Pseudophakia, both eyes   Done by outside provider. Overall doing well. Monitor.      Insufficiency, tear film  Has been on restasis  twice a day  Rn out of  Will restart and use artificial tears twice a day        I discussed the above assessment and plan with the patient. Patient had the opportunity to ask questions, and questions and concerns were addressed. Patient was reminded to call if there is any significant change or worsening in vision, or to get an evaluation, urgently if appropriate.      FOLLOW-UP    Return in about 3 months (around 11/27/2023) for IOP Check, VF 24-2, Posterior Pole Macular OCT.        Ryder System        HISTORY OF PRESENT ILLNESS:  Reason for Visit:    HPI      PAST HISTORY:  Active Ambulatory Problems     Diagnosis Date Noted    Essential hypertension 06/21/2004    Hyperlipidemia 10/31/2007    Prediabetes 10/31/2007    Nevus of choroid 08/22/2016    Insufficiency, tear film 08/22/2016    Chronic nonalcoholic liver disease 06/21/2004    Primary open angle glaucoma (POAG) of both eyes 04/15/2019    Hardening of the aorta (main artery of the heart) (HCC/RAF) 10/21/2019    Abnormal tomography of chest 01/20/2018    Atherosclerosis of native coronary artery of native heart 10/30/2021    Aortic regurgitation 03/14/2022    Noncompliance with medication regimen 03/14/2022    Glaucoma suspect of both eyes 08/27/2023    Pseudophakia, both eyes 08/27/2023     Resolved Ambulatory Problems     Diagnosis Date Noted    No Resolved Ambulatory Problems     Past Medical History:   Diagnosis Date    Glaucoma     Hypertension       Specialty Problems          Eye/Vision Problems    Prediabetes        Insufficiency, tear film        Nevus of choroid        Primary open angle glaucoma (POAG) of both eyes        Glaucoma suspect of both eyes        Pseudophakia, both eyes           Past Surgical History:   Procedure Laterality Date    BREAST SURGERY        Family History   Problem Relation Age of Onset    Coronary artery disease Father      Social History     Tobacco Use    Smoking status: Never    Smokeless tobacco: Never   Vaping Use    Vaping  status: Never Used   Substance Use Topics    Alcohol use: Yes    Drug use: No    Allergies   Allergies   Allergen Reactions    Amlodipine      Other reaction(s): Other  Edema        CURRENT PRESCRIPTIONS  Medications (active prior to today)   Outpatient Medications Prior to Visit   Medication Sig    ALBUTEROL  90 mcg/act inhaler INHALE 1 PUFF BY MOUTH EVERY 4 HOURS AS NEEDED FOR SHORTNESS OF BREATH OR WHEEZING    aspirin  (ASPIRIN  LOW DOSE) 81 mg EC tablet Take 1 tablet (81 mg total) by mouth daily.    atorvastatin  10 mg tablet Take 1 tablet (10 mg total) by mouth at bedtime.    losartan -hydroCHLOROthiazide  100-25 mg tablet Take 1 tablet by mouth daily.    MIEBO  1.338 GM/ML SOLN Place 1 drop into both eyes four (4) times daily as needed (Dry eyes).    prednisoLONE  acetate 1% ophthalmic suspension Place 2 drops into both eyes two (2) times daily.    sodium sulfate-potassium sulfate-magnesium sulfate solution Take as directed    valACYclovir  1000 mg tablet Take 2 tablets (2,000 mg total) by mouth two (2) times daily as needed (for 1 day per episode).     No facility-administered medications prior to visit.     Medications   Current Outpatient Medications   Medication Sig    ALBUTEROL  90 mcg/act inhaler INHALE 1 PUFF BY MOUTH EVERY 4 HOURS AS NEEDED FOR SHORTNESS OF BREATH OR WHEEZING    aspirin  (ASPIRIN  LOW DOSE) 81 mg EC tablet Take 1 tablet (81 mg total) by mouth daily.    atorvastatin  10 mg tablet Take 1 tablet (10 mg total) by mouth at bedtime.    cycloSPORINE  0.05% ophthalmic emulsion Place 1 drop into both eyes two (2) times daily.    latanoprost  0.005% ophthalmic solution Place 1 drop into both eyes every evening.    losartan -hydroCHLOROthiazide  100-25 mg tablet Take 1 tablet by mouth daily.    MIEBO  1.338 GM/ML SOLN Place 1 drop into both eyes four (4) times daily as needed (Dry eyes).    prednisoLONE  acetate 1% ophthalmic suspension Place 2 drops into both eyes two (2) times daily.    sodium sulfate-potassium sulfate-magnesium sulfate solution Take as directed    valACYclovir  1000 mg tablet Take 2 tablets (2,000 mg total) by mouth two (2) times daily as needed (for 1 day per episode).     No current facility-administered medications for this visit.    Review of systems    Exam   Base Eye Exam       Visual Acuity (Snellen - Linear)         Right Left    Dist sc 20/25 20/30    Dist ph sc  NI              Tonometry ([facility], 2:02 PM)         Right Left    Pressure 19 19              Pachymetry (08/27/2023)         Right Left    Thickness 592 602              Gonioscopy         Right Left    Temporal SS 1+ pig SS 1+ pig    Nasal SS 1+ pig SS 1+ pig    Superior SS  1+ pig SS 1+ pig    Inferior SS 1+ pig SS 1+ pig              Pupils         Pupils    Right PERRL    Left PERRL              Visual Fields         Left Right     Full Full              Extraocular Movement         Right Left     Full Full              Neuro/Psych       Oriented x3: Yes    Mood/Affect: Normal              Dilation       Both eyes: 0.5% Mydriacyl, 2.5% Phenylephrine @ 2:02 PM                  Slit Lamp and Fundus Exam       External Exam         Right Left    External Normal Normal              Slit Lamp Exam Right Left    Lids/Lashes Normal Normal    Conjunctiva/Sclera White and quiet White and quiet    Cornea Clear Clear    Anterior Chamber Deep and quiet Deep and quiet    Iris Normal Normal    Lens Posterior chamber intraocular lens Posterior chamber intraocular lens    Anterior Vitreous Vitreous syneresis Vitreous syneresis              Fundus Exam         Right Left    Disc Thin rim Thin rim    Macula Normal Normal    Vessels Normal Normal    Periphery Normal Normal                    Test Interpretation   OCT OPTIC NERVE HEIDELBERG OU (BOTH EYES)          Quality  Right Eye  Good.     Left Eye  Good.     NFL Interpretation  Right Eye  Normal.     Left Eye  Superior loss, Inferior loss.     Interval Change:  Right Eye  Initial.     Left Eye  Initial.     Notes  Authorizing Provider: JENENE BLOSSOM  Diagnosis: Glaucoma suspect of both eyes           Tests Today   Orders Placed This Encounter    latanoprost  0.005% ophthalmic solution    cycloSPORINE  0.05% ophthalmic emulsion         Instructions/Education There are no Patient Instructions on file for this visit.

## 2023-09-01 ENCOUNTER — Telehealth: Payer: PRIVATE HEALTH INSURANCE

## 2023-09-01 NOTE — Telephone Encounter
 Confirmation Documentation   Patient is scheduled on (09/09/2023) for:   [x]  Colonoscopy   []  Upper Endoscopy   []  Pouchoscopy   []  Upper Endoscopy w/Bravo   []  Upper Endoscopy w/ Esophageal Manometry   []  Upper Endoscopy w/ Esophageal Manometry & pH   []  Upper Endoscopy w/Endoflip   []  Sigmoidoscopy   []  Anoscopy   []  Illeoscopy   []  Small Bowel Enteroscopy   []  Esophageal Manometry   []  Anorectal Manometry   []  pH Study   []  Capsule Endoscopy    Informed patient of the following:   []  Arrival time   []  Location including suite number   []  Transportation   []  Mac Script   []  Does patient have prep instructions?   []  Informed patient to call back should there be any questions?   []  Does procedure order match the case scheduled    NOTE: If patients have any questions regarding prescribed medication patient needs to contact their prescribing physician to ensure it is ok to stop medications.

## 2023-09-04 ENCOUNTER — Other Ambulatory Visit: Payer: PRIVATE HEALTH INSURANCE

## 2023-09-04 ENCOUNTER — Telehealth: Payer: PRIVATE HEALTH INSURANCE

## 2023-09-04 NOTE — Telephone Encounter
 SW with patient to remind and confirm procedure scheduled for 09/09/2023  Check-in time:12:00 pm  Procedure time: 1:00pm  Advised patient to take any blood pressure, heart, anti-seizurals and inhaler medications that morning of the procedure. Patient advised if using an inhaler to bring to the procedure. Advised patient they must have a ride home from the procedure with someone that can accompany them home. Medical procedure staff must be able to confirm ride when they arrive or the procedure will be cancelled.     Advised to call back with any questions or concerns.

## 2023-09-06 ENCOUNTER — Other Ambulatory Visit: Payer: PRIVATE HEALTH INSURANCE

## 2023-09-08 DIAGNOSIS — K529 Noninfective gastroenteritis and colitis, unspecified: Secondary | ICD-10-CM

## 2023-09-08 DIAGNOSIS — H401130 Primary open-angle glaucoma, bilateral, stage unspecified: Secondary | ICD-10-CM

## 2023-09-08 DIAGNOSIS — H04123 Dry eye syndrome of bilateral lacrimal glands: Secondary | ICD-10-CM

## 2023-09-08 MED ORDER — MIEBO 1.338 GM/ML OP SOLN
1 [drp] | Freq: Four times a day (QID) | OPHTHALMIC | 0 refills | PRN
Start: 2023-09-08 — End: ?

## 2023-09-08 NOTE — Discharge Instructions
 ACTIVITY:    [x]  Avoid any strenuous physical activity for 24 hours    [x]  No driving or operating heavy machinery for 24 hours    [x]  No alcoholic beverages for 24 hours (may interact with some medications you received during your procedure)    [x]  May use a heating pad on low temperature for any abdominal cramping.      DIET:    [x]  Resume your usual diet    []  Specific diet instructions:     Call Dr. Dorita Sciara office if you experience any of the following:      [x]  Severe chest pain, difficulty breathing    [x]  Passage of blood clots, black tarry stools, vomiting blood    [x]  Severe inflammation at the I.V. site    [x]  Difficulty swallowing or persistent vomiting    [x]  Abdominal pain    [x]  Chills or fever greater than 101 F or 38.4 C within 24 hours of procedure    FOLLOW-UP CARE:        []  Call Dr. Dorita Sciara office or schedule through Mychart for appointment as needed   [x]  Call Dr. Dorita Sciara office or schedule through Mychart for appointment in 2-8 weeks   []  Call your primary MD for appointment as needed   [x]  Avoid full dose Aspirin or other NSAIDs for the next 7 days.   [x]  Ok to continue baby aspirin (Aspirin 81mg ) if previously prescribed.     [x]  Ok to resume all home medications.

## 2023-09-08 NOTE — H&P
 Indication for Procedure:   See below    Level of sedation intended for procedure: moderate, deep, MAC  []  Moderate []  Deep [x]  MAC []  Anesthesia   Prior sedation problems []  Yes [x]  No  If yes explain:  Teeth: [x]  Intact []  Loose []  Missing []  Dentures []  Bridges  [place] Visualized: [x]  Yes []  Partially []  not at all  Neck ROM: [x]   Normal []  Limited  Past Medical History:  Past Medical History:   Diagnosis Date    Glaucoma     Hypertension        Past Surgical History:  Past Surgical History:   Procedure Laterality Date    BREAST SURGERY          Family History:  Family History   Problem Relation Age of Onset    Coronary artery disease Father         Social History:  Social History     Socioeconomic History    Marital status: Divorced   Tobacco Use    Smoking status: Never    Smokeless tobacco: Never   Vaping Use    Vaping status: Never Used   Substance and Sexual Activity    Alcohol use: Yes    Drug use: No    Sexual activity: Not Currently   Other Topics Concern    1. Need Help Feeding Yourself? No    2. Need Help Getting Dressed? No    3. Need Help Using the Telephone? No    4. 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 Need Help Shopping for Groceries? No    6. Need Help Getting Places Beyond Walking Distance? (Bus, Taxi) No    7. Need Help Getting from Bed to Chair? No    8. Need Help Bathing or Showering? No    9. Need Help Taking your Medications? No    10.  Need Help Doing Moderately Strenuous Housework? (ex. Laundry) No    11. Need Help Driving? No    12. Need Help Getting to the Toilet? No    13. Need Help Walking Across the Road? (Includes [place], [place]) No    14. Need Help Preparing Meals? No    15. Need Help Shopping for Personal Items? (Toiletries, Medicines) No    16. Need Help Climbing a Flight of Stairs? No    17. Do you live with someone who assists you at home? No    18. Do you get help from family members or friends in your home? No    19. Do you employ someone to provide health related care or help you in your home? No    20. Do you provide care for a family member? No    21. Does your home have rugs in the hallway? No    22. Does your home have poor lighting? No    23. Does your home lack grab bars in the bathroom? No    24. Does your home lack handrails on the stairs? Yes    25. Have you noticed any hearing difficulties? No    26. Do you currently participate in any regular activity to improve or maintain your physical fitness? Yes    27. Do you always wear a seatbelt when you ride in a car? Yes    28. If you drink alcohol, do you drink more than 7 drinks per week or more than 3 drinks on any given day? No    29.  Has anyone ever been concerned about your drinking? No    Do you exercise at least a day, 3 or more days a week? Yes    Types of Exercise? (List in Comments) No    Do you follow a special diet? No    Vegan? No    Vegetarian? No    Pescatarian? No    Lactose Free? No    Gluten Free? No    Omnivore? No       Allergies:  Allergies   Allergen Reactions    Amlodipine      Other reaction(s): Other  Edema        Current Medications:  No current facility-administered medications for this encounter.     Current Outpatient Medications   Medication Sig    ALBUTEROL  90 mcg/act inhaler INHALE 1 PUFF BY MOUTH EVERY 4 HOURS AS NEEDED FOR SHORTNESS OF BREATH OR WHEEZING    aspirin  (ASPIRIN  LOW DOSE) 81 mg EC tablet Take 1 tablet (81 mg total) by mouth daily.    atorvastatin  10 mg tablet Take 1 tablet (10 mg total) by mouth at bedtime.    cycloSPORINE  0.05% ophthalmic emulsion Place 1 drop into both eyes two (2) times daily.    latanoprost  0.005% ophthalmic solution Place 1 drop into both eyes every evening.    losartan -hydroCHLOROthiazide  100-25 mg tablet Take 1 tablet by mouth daily.    MIEBO  1.338 GM/ML SOLN Place 1 drop into both eyes four (4) times daily as needed (Dry eyes).    prednisoLONE  acetate 1% ophthalmic suspension Place 2 drops into both eyes two (2) times daily.    sodium sulfate-potassium sulfate-magnesium sulfate solution Take as directed    valACYclovir  1000 mg tablet Take 2 tablets (2,000 mg total) by mouth two (2) times daily as needed (for 1 day per episode).     ROS:  Constitutional: The patient denies any fever or chills.  HEENT: no symptoms  Skin: Patient denies bruising, rashes or itching.  Cardiovascular: Patient denies any chest pains.  There is no palpitations.  Respiratory: Patient denies any shortness of breath.  There is no cough or congestion.    Gastrointestinal: as in HPI and chief complaints  Genitourinary: Patient denies any symptoms of frequency urination or burning urination.  Neurological: Patient denies any lightheadedness, dizziness or loss of consciousness.  Psychiatric: Patient denies any symptoms of anxiety, depression or agitation.  Musculoskeletal: Patient denies any ankle swelling.      Physical Exam:    Vitals Signs: LMP  (LMP Unknown)   Constitutional: Patient appears very comfortable.    HEENT: normal exam  Cardiovascular: RRR.  There is no irregular heartbeat.  Respiratory: Patient is not short of breath.  There is no wheezing or rales.  Non-labored, equal rise. Sating well.   Integumentry: There is no bruising.  No rashes are noted.  Neurological: Alert, oriented x 4, normal affect  Psychiatric: Patient is not depressed, apprehensive or agitated.    Assessment:  Nina Cox is a 73 y.o.female who is brought to the endoscopy suite for the procedure because of the following indications:   Occult blood positive stool    Patient Active Problem List   Diagnosis    Essential hypertension    Hyperlipidemia    Prediabetes    Nevus of choroid    Insufficiency, tear film    Chronic nonalcoholic liver disease    Primary open angle glaucoma (POAG) of both eyes    Hardening of the aorta (  main artery of the heart) (HCC/RAF)    Abnormal tomography of chest    Atherosclerosis of native coronary artery of native heart    Aortic regurgitation Noncompliance with medication regimen    Glaucoma suspect of both eyes    Pseudophakia, both eyes    Chronic diarrhea    Occult blood positive stool       The procedure and its possible complications including but not limited to bleeding and perforation and their treatment including but not limited to blood transfusion and even surgery was once again explained to, and understood by the patient. Patient agrees to proceed with the procedure.    ASA Classification:   See anesthesia note    I have reviewed the history and physical and have determined Nina Cox to be an appropriate candidate and medically stable to undergo the procedure with planned sedation and analgesia.    Patient cleared for discharge once discharge criteria is met.

## 2023-09-08 NOTE — Telephone Encounter
 Rx Pend   Please review    Can you send over 2 prescriptions for Miebo  (perfluorohexyloctane  ophthalmic solution)     I am asking for 2 because the bottles are so small.    Outpatient Medication Detail     Disp Refills Start End DAW   MIEBO  1.338 GM/ML SOLN 3 mL 0 07/24/2023 -- Yes   Sig: Place 1 drop into both eyes four (4) times daily as needed (Dry eyes).   Sent to pharmacy as: MIEBO  1.338 GM/ML SOLN   Route: Both Eyes   E-Prescribing Status: Receipt confirmed by pharmacy (07/24/2023  2:38 PM PDT)     Default Preferred Pharmacy (Encounter-Specific)    [id number] [place], [place] - [address] AT [facility] [address]   [address] [place] [address]   Phone: [phone] Fax: [phone]   Hours: Not open 24 hours

## 2023-09-08 NOTE — H&P
 UPDATED H&P REQUIREMENT    WHAT IS THE STATUS OF THE PATIENT'S MOST CURRENT HISTORY AND PHYSICAL?   - There is no recent H&P <30 days.  Proceed to H&P Notes section for new H&P.     REFER TO MEDICAL STAFF POLICIES REGARDING PRE-PROCEDURE HISTORY AND PHYSICAL EXAMINATION AND UPDATED H&P REQUIREMENTS BELOW:    Karle Ovens [facility] Medical Staff Policy 200 - For Patients Undergoing Procedures Requiring Moderate or Deep Sedation, General Anesthesia or Regional Anesthesia    Contents of a History and Physical Examination (H&P):    The H&P shall consist of chief complaint, history of present illness, allergies and medications, relevant social and family history, past medical history, review of systems and physical examination, and assessment and plan appropriate to the patient's age.    For Patients Undergoing Procedures Requiring Moderate or Deep Sedation, General Anesthesia or Regional Anesthesia:    1. An H&P shall be performed within 24 hours prior to the procedure by a qualified member of the medical staff or designee with appropriate privileges, except as noted in item 2 below.    2. If a complete history and physical was performed within thirty (30) calendar days prior to the patient?s admission to the Medical Center for elective surgery, a member of the medical staff assumes the responsibility for the accuracy of the clinical information and will need to document in the medical record within twenty-four (24) hours of admission and prior to surgery or major invasive procedure, that they either attest that the history and physical has been reviewed and accepted, or document an update of the original history and physical relevant to the patient's current clinical status.    3. Providing an H&P for patients undergoing surgery under local anesthesia is at the discretion of the Attending Physician.     4. When a procedure is performed by a dentist, podiatrist or other practitioner who is not privileged to perform an H&P, the anesthesiologist?s assessment immediately prior to the procedure will constitute the 24 hour re-assessment.The dentist, podiatrist or other practitioner who is not privileged to perform an H&P will document the history and physical relevant to the procedure.    5. If the H&P and the written informed consent for the surgery or procedure are not recorded in the patient's medical record prior to surgery, the operation shall not be performed unless the attending physician states in writing that such a delay could lead to an adverse event or irreversible damage to the patient.    6. The above requirements shall not preclude the rendering of emergency medical or surgical care to a patient in dire circumstances.

## 2023-09-09 ENCOUNTER — Telehealth: Payer: PRIVATE HEALTH INSURANCE

## 2023-09-09 MED ADMIN — LIDOCAINE HCL (PF) 1 % IJ SOLN: INTRAVENOUS | @ 20:00:00 | Stop: 2023-09-09 | NDC 00143959525

## 2023-09-09 MED ADMIN — SODIUM CHLORIDE 0.9 % IV SOLN: INTRAVENOUS | @ 20:00:00 | Stop: 2023-09-09 | NDC 00338004904

## 2023-09-09 MED ADMIN — PROPOFOL 200 MG/20ML IV EMUL: INTRAVENOUS | @ 21:00:00 | Stop: 2023-09-09 | NDC 63323026929

## 2023-09-09 MED ADMIN — PROPOFOL 200 MG/20ML IV EMUL: INTRAVENOUS | @ 20:00:00 | Stop: 2023-09-09 | NDC 63323026929

## 2023-09-09 MED ADMIN — PROPOFOL 200 MG/20ML IV EMUL (ANES): INTRAVENOUS | @ 20:00:00 | Stop: 2023-09-09

## 2023-09-09 MED ADMIN — SODIUM CHLORIDE 0.9% IV SOLN (1000 ML): 10 mL/h | INTRAVENOUS | @ 20:00:00 | Stop: 2023-09-10 | NDC 00264780000

## 2023-09-09 NOTE — Procedures
 COLONOSCOPY Procedure Report  PATIENT NAME: Nina Cox DATE/TIME OF PROCEDURE: 09/09/2023 / 01:00 PM  DATE OF BIRTH: [birth year] ENDOSCOPIST: Tanda Drum,  RECORD NUMBER: [id number] REFERRING PHYSICIAN:  FELLOW: ,  INDICATION FOR EXAMINATION: diarrhea  PROCEDURE PERFORMED : COLONOSCOPY W/ biopsy, snare  MEDICATIONS: MAC Anesthesia  PROCEDURE TECHNIQUE:  Patient's medications, allergies, past medical, surgical, social and family histories were reviewed and updated as  appropriate. A discussion of informed consent was had with the patient and/or the patient's family prior to the  procedure, including sedation. The alternatives, benefits and risks of the procedure including but not limited to  perforation, hemorrhage, infection, adverse drug reaction and aspiration were discussed  Description of Procedure:  After discussion of informed consent, and appropriate level of sedation were attained, the patient was placed in  the left lateral position. The patient was monitored continuously with pulse oximetry, blood pressure monitoring,  and direct observations.  After digital rectal examination, the colonoscope was inserted into the rectum and advanced under direct vision to  the level of the cecum. The quality of the colonic preparation was good. A careful inspection was made as the  colonoscope was withdrawn. Findings and interventions are described below.  TOTAL WITHDRAWAL TIME: 23 minutes  EXTENT OF EXAM: terminal ileum  INSTRUMENTS: [id number] #[id number]  TECHNICALLY DIFFICULT EXAM: No  LIMITATIONS: None TOLERANCE: Good VISUALIZATION: Good  BOSTON BOWEL PREP SCALE: LEFT: RIGHT: TRANSVERSE: TOTAL: 0  FINDINGS:  Rectal exam was normal.  The terminal ileum, cecum, Ileocecal valve, appendiceal orifice, ascending colon, transverse colon, descending  colon, sigmoid colon and rectum appeared normal. Random right and left colon biopsies done with cold forceps  to evaluate for microscopic colitis.  Retroflexion exam showed small internal hemorrhoids.  5mm sessile polyp in the transverse colon successfully removed with cold snare.  8mm sessile polyp in the rectum successfully removed with cold snare.  10mm submucosal nodule in the cecum biopsied.  Multiple small mouthed diverticulum were seen in the whole colon. Diverticulosis  DIAGNOSIS:  COLONOSCOPY Procedure Report  Rectal exam was normal.  The terminal ileum, cecum, Ileocecal valve, appendiceal orifice, ascending colon, transverse colon, descending colon,  sigmoid colon and rectum appeared normal. Random right and left colon biopsies done with cold forceps to evaluate for  microscopic colitis.  Retroflexion exam showed small internal hemorrhoids.  5mm sessile polyp in the transverse colon successfully removed with cold snare.  8mm sessile polyp in the rectum successfully removed with cold snare.  10mm submucosal nodule in the cecum biopsied.  Diverticulosis  RECOMMENDATIONS:  Repeat colonoscopy based on pathology results.  Return to clinic with Dr. Drum  This note was electronically signed on 09/09/2023, 02:00:02 PM by Drum Tanda DEL,

## 2023-09-09 NOTE — Telephone Encounter
 LM requesting ETA for Procedure today 09/09/2023.  Check in time was 12:00 PM

## 2023-09-10 ENCOUNTER — Ambulatory Visit: Payer: PRIVATE HEALTH INSURANCE | Attending: Student in an Organized Health Care Education/Training Program

## 2023-09-10 ENCOUNTER — Ambulatory Visit: Payer: PRIVATE HEALTH INSURANCE

## 2023-09-10 DIAGNOSIS — I2510 Atherosclerotic heart disease of native coronary artery without angina pectoris: Secondary | ICD-10-CM

## 2023-09-10 DIAGNOSIS — E782 Mixed hyperlipidemia: Secondary | ICD-10-CM

## 2023-09-10 DIAGNOSIS — R200 Anesthesia of skin: Secondary | ICD-10-CM

## 2023-09-10 DIAGNOSIS — R053 Chronic cough: Secondary | ICD-10-CM

## 2023-09-10 DIAGNOSIS — Z91148 Noncompliance with medication regimen: Secondary | ICD-10-CM

## 2023-09-10 DIAGNOSIS — I351 Nonrheumatic aortic (valve) insufficiency: Secondary | ICD-10-CM

## 2023-09-10 DIAGNOSIS — R197 Diarrhea, unspecified: Secondary | ICD-10-CM

## 2023-09-10 DIAGNOSIS — I10 Essential (primary) hypertension: Secondary | ICD-10-CM

## 2023-09-10 DIAGNOSIS — H04123 Dry eye syndrome of bilateral lacrimal glands: Secondary | ICD-10-CM

## 2023-09-10 DIAGNOSIS — R7303 Prediabetes: Secondary | ICD-10-CM

## 2023-09-10 DIAGNOSIS — I700 Atherosclerosis of aorta: Secondary | ICD-10-CM

## 2023-09-10 DIAGNOSIS — B001 Herpesviral vesicular dermatitis: Secondary | ICD-10-CM

## 2023-09-10 DIAGNOSIS — J4540 Moderate persistent asthma, uncomplicated: Secondary | ICD-10-CM

## 2023-09-10 DIAGNOSIS — E785 Hyperlipidemia, unspecified: Secondary | ICD-10-CM

## 2023-09-10 DIAGNOSIS — Z131 Encounter for screening for diabetes mellitus: Secondary | ICD-10-CM

## 2023-09-10 DIAGNOSIS — Z7689 Persons encountering health services in other specified circumstances: Principal | ICD-10-CM

## 2023-09-10 DIAGNOSIS — K529 Noninfective gastroenteritis and colitis, unspecified: Secondary | ICD-10-CM

## 2023-09-10 DIAGNOSIS — H401130 Primary open-angle glaucoma, bilateral, stage unspecified: Secondary | ICD-10-CM

## 2023-09-10 DIAGNOSIS — R2689 Other abnormalities of gait and mobility: Secondary | ICD-10-CM

## 2023-09-10 MED ORDER — VALACYCLOVIR HCL 1 G PO TABS
ORAL_TABLET | ORAL | 3 refills | 30.00000 days | Status: AC
Start: 2023-09-10 — End: ?

## 2023-09-10 MED ORDER — LIDOCAINE-PRILOCAINE 2.5-2.5 % EX CREA
Freq: Every day | TOPICAL | 1 refills | 30.00000 days | Status: AC | PRN
Start: 2023-09-10 — End: ?

## 2023-09-10 MED ORDER — BUDESONIDE-FORMOTEROL FUMARATE 80-4.5 MCG/ACT IN AERO
2 | Freq: Two times a day (BID) | RESPIRATORY_TRACT | 6 refills | 30.00000 days | Status: AC
Start: 2023-09-10 — End: ?

## 2023-09-10 MED ORDER — LIDOCAINE 5 % EX OINT
Freq: Every day | TOPICAL | 1 refills | 30.00000 days | Status: AC | PRN
Start: 2023-09-10 — End: 2023-09-11

## 2023-09-10 MED ORDER — BUDESONIDE-FORMOTEROL FUMARATE 80-4.5 MCG/ACT IN AERO
2 | Freq: Two times a day (BID) | RESPIRATORY_TRACT | 6 refills | 30.00000 days | Status: AC
Start: 2023-09-10 — End: 2023-09-11

## 2023-09-10 MED ORDER — ALBUTEROL SULFATE HFA 108 (90 BASE) MCG/ACT IN AERS
2 | Freq: Four times a day (QID) | RESPIRATORY_TRACT | 6 refills | 25.00000 days | Status: AC | PRN
Start: 2023-09-10 — End: ?

## 2023-09-10 NOTE — Patient Instructions
 Pneumococcal 20-Valent Conjugate Vaccine Prefilled Syringe  Brands: Prevnar 20  Uses  Vaccine to prevent a bacterial infection.  Instructions  Tell your doctor if you have ever had an allergic reaction to latex.  Tell your doctor and pharmacist about all your medicines. Include prescription and over-the-counter medicines, vitamins, and herbal medicines.  Cautions  Tell your doctor and pharmacist if you ever had an allergic reaction to a medicine.  Speak with your health care provider before receiving any vaccinations.  Tell the doctor or pharmacist if you are pregnant, planning to be pregnant, or breastfeeding.  Side Effects  The following is a list of some common side effects from this medicine. Please speak with your doctor about what you should do if you experience these or other side effects.  decreased appetite  drowsiness or sedation  lack of energy and tiredness  fever or chills  headaches  pain, redness, swelling near injection  irritability  muscle pain  nausea  Call your doctor or get medical help right away if you notice any of these more serious side effects:  dizziness  ear problems (ringing in the ears, hearing loss)  fainting  seizures  blurring or changes of vision  A few people may have an allergic reaction to this medicine. Symptoms can include difficulty breathing, skin rash, itching, swelling, or severe dizziness. If you notice any of these symptoms, seek medical help quickly.  Extra  Please speak with your doctor, nurse, or pharmacist if you have any questions about this medicine.  [email or web address]  IMPORTANT NOTE: This document tells you briefly how to take your medicine, but it does not tell you all there is to know about it. Your doctor or pharmacist may give you other documents about your medicine. Please talk to them if you have any questions. Always follow their advice. There is a more complete description of this medicine available in [place]. Scan this code on your smartphone or tablet or use the web address below. You can also ask your pharmacist for a printout. If you have any questions, please ask your pharmacist. The display and use of this drug information is subject to Terms of Use. Copyright(c) 2023 [employer]   ? 2000-2023 [employer], [place]. All rights reserved. This information is not intended as a substitute for professional medical care. Always follow your healthcare professional's instructions.     Shingles (Herpes Zoster) and Shingrix Vaccine     Talk to your healthcare provider about the shingles vaccine.     Shingles is also called herpes zoster. It's a painful skin rash caused by the herpes zoster virus. This is the same virus that causes chickenpox. After a person has chickenpox, the virus stays inactive in the nerve cells. Years later, the virus can become active again and travel along the nerve to the skin. Most people have shingles only once. But it's possible to have it more than once.   Who is at risk for shingles?  Anyone who has ever had chickenpox can get shingles. But your risk is greater if you:   Are age 17 or older  Have an illness that weakens your immune system, such as HIV/AIDS  Have cancer, especially Hodgkin disease or lymphoma  Take medicines that weaken your immune system  What are the symptoms of shingles?  The first sign of shingles is often pain, burning, tingling, or itching on one part of your face or body. You may also feel as if you have the flu,  with fever and chills.  A red rash with small blisters appears in a few days. The rash may look as follows:   The blisters can occur anywhere, but they?re most common on the back, chest, or belly (abdomen).  They usually appear on only one side of the body, spreading along the nerve pathway where the virus is reactivating.   The rash can also form around an eye, along one side of the face or neck, or in the mouth.  In a few people, often those with a weak immune system, shingles appear on more than one part of the body at once.  After a few days, the blisters become dry and form a crust. The crust falls off in days to weeks. The blisters generally don't leave scars. But they can in severe cases. Or if someone has a weak immune system.    How is shingles treated?  For most people, shingles heals on its own in a few days or weeks. But treatment is advised to help ease pain, speed healing, and reduce the risk for complications. Antiviral medicines are most often only prescribed if you are seen by a healthcare provider within the first 72 hours of having the rash. But antiviral medicines may be prescribed even after 72 hours if your immune system is weak. Or if the infection is extensive, severe, or isn't going away. To reduce symptoms:   Apply ice packs or cool compresses, or soak in a cool bath. To make an ice pack, use a bag of frozen vegetables or put ice cubes in a plastic bag that seals at the top. Wrap the bag in a clean, thin towel or cloth. Never put ice or an ice pack directly on the skin.  Use calamine lotion to calm itchy skin.  Ask your provider about over-the-counter pain relievers. If your pain is severe, your provider may prescribe stronger pain medicines.  What are possible complications of shingles?   Shingles often goes away with no lasting effects. But some people have complications during or after the infection comes out:   Postherpetic neuralgia. This is the most common complication. It's more likely as people age, especially after age 36. It's nerve pain at the place where the rash used to be. It can range from mild to severe. It can last for only a few days. Or it can last for months or even years after you have had shingles. Antiviral medicines given during the first 72 hours of the rash can reduce the chance of postherpetic neuralgia. Other medicines can help ease the pain and improve quality of life.  Bacterial infection. Shingles blisters may get infected with bacteria. Depending on the severity of the infection, topical, oral or IV (intravenous) antibiotic medicine is used to treat the infection.  Eye problems. If you have shingles on the face, see your healthcare provider right away. Shingles can cause serious problems with vision, and even blindness.  In very rare cases, shingles can also lead to pneumonia, hearing problems, brain inflammation, or even death.    When to get medical care  Call your healthcare provider if you have any of these:   New symptoms or symptoms that don?t go away with treatment  A rash or blisters near your eye  More drainage, fever, or rash after treatment  Severe pain that doesn?t go away  How can shingles be prevented?  You can only get shingles if you have had chickenpox in the past. Someone who never had chickenpox  can get the virus from you. But instead of have shingles, the person may get chickenpox. Until your blisters form scabs, don't have any contact with others, especially the following:   Pregnant women who have never had chickenpox or the vaccine  Babies who were born early (premature) or who had low weight at birth  People with weak immune systems. This includes people getting chemotherapy for cancer, people who have had organ transplants, and people with HIV infections. The risk is higher if you've never had chickenpox.  The shingles vaccine  The recombinant zoster vaccine (RZV) shingles vaccine can help prevent shingles or make it less painful.   You should get the RZV shingles vaccine if you are healthy and age 70 or older, even if you've had shingles in the past. Two shots of the RZV vaccine are needed. You should get the second RZV shot 2 to 6 months after the first. The vaccine makes it less likely that you will develop shingles. If you do develop shingles, your symptoms will likely be milder than if you hadn?t been vaccinated. RZV is also advised even if you had the older shingles vaccine (zoster live vaccine, ZVL) in the past. That's because the RZV vaccine works better and protects you from shingles longer.   Talk with your healthcare provider about the best time to get vaccinated.   [employer] last reviewed this educational content on 03/06/2020  ? 2000-2023 [employer], [place]. All rights reserved. This information is not intended as a substitute for professional medical care. Always follow your healthcare professional's instructions.

## 2023-09-10 NOTE — H&P
 [facility]  [address]  [place], [place] [address]  Phone: [phone]   Fax: [phone]    PATIENT: Nina Cox    MRN: [id number]    DOB: 1950-03-08    PCP: Laurell Beola DASEN., MD  DATE OF SERVICE: 09/10/2023    Chief Complaint   Patient presents with    Establish Care     Medication concern        HPI   Nina Cox is a 73 y.o. female here for     Ambient Scribe Use Consent: This note was generated using assisted transcription with the verbal consent of the patient/guardian. The note has been reviewed and edited accordingly by me.    Establish care  - Last seen PCP:  08/2022 Dr. Surina Kajani  - Other providers:   - Cardiology Dr. Emi Schiller   - Ophthalmology Dr. Norwood Rubens   - GI Dr. Tanda Drum    Asthma  - Reports daily cough and frequent wheezing prior to the encounter  - Uses albuterol  inhaler, but not often enough according to her own assessment  - Has not used maintenance inhaler therapy prior to the encounter  - No history of prediabetes or diabetes reported    Cold sores (Herpes labialis)  - Experiences recurrent cold sores, approximately four times per year  - Reports that cold sores resolve with valacyclovir  (Valtrex ) use  - Requests numbing cream for cold sores, has previously used lidocaine  ointment 5% and is interested in a stronger formulation    Follow up bilateral eye discomfort/pain and dry eyes  Bilateral open-angle glaucoma  Bilateral macular degeneration  Ophthalmologic symptoms  - Reports persistent eye discomfort described as ''terrible,'' ''constant torment,'' and ''irritating''  - Recently saw ophthalmologist in [place] (Ophthalmology Dr. Norwood Rubens) within the past week on 08/27/2023  - Unable to recall the name of her eye drop medication, did not obtain a refill at last visit  - Plans to return to ophthalmologist on 12/01/2023 for further evaluation  - No history of Sjogren syndrome, CREST syndrome, or rheumatoid arthritis reported  - Refer to note from 11/25/2022 for details, experiencing light sensitivity bilaterally, right appears worse than left, endorses blurry vision without double vision, some peripheral vision loss  - Endorses worse symptoms on right eye compared to left  - Denies fevers, chills, eye discharge, or pain with eye movement  - Also reports moving houses, and misplaced her eyedrops, requested refills     Balance and gait concerns  - Reports increasing unsteadiness and balance issues prior to the encounter  - Requests information about interventions to improve balance  - Reports progressive stooping posture and requests a back brace  - Denies history of falls or trauma    Hyperlipidemia  - Previously prescribed medication for lipid management, reports improvement in lipid levels when adherent    Colonoscopy  - Underwent colonoscopy on 09/09/2023, following a positive FIT test from 10/28/2021  - Awaiting pathology results    Misc  - Preparing to move to [place]  on 09/21/2023  - Plans to remain in current area for five days after move-out before relocating to new house on 09/28/2023        PMH/ALL/MEDS/FH/SH/IMMUNIZATION        Patient Active Problem List    Diagnosis Date Noted    Recurrent cold sores 09/10/2023    Moderate persistent asthma without complication 09/10/2023    Dry eyes 09/10/2023  Chronic diarrhea 09/08/2023    Occult blood positive stool 09/08/2023    Glaucoma suspect of both eyes 08/27/2023    Pseudophakia, both eyes 08/27/2023    Aortic regurgitation 03/14/2022    Noncompliance with medication regimen 03/14/2022    Atherosclerosis of native coronary artery of native heart 10/30/2021    Hardening of the aorta (main artery of the heart) (HCC/RAF) 10/21/2019    Primary open angle glaucoma (POAG) of both eyes 04/15/2019    Abnormal tomography of chest 01/20/2018    Nevus of choroid 08/22/2016    Insufficiency, tear film 08/22/2016    Hyperlipidemia 10/31/2007    Prediabetes 10/31/2007 Primary hypertension 06/21/2004    Chronic nonalcoholic liver disease 06/21/2004     Overview:   Saw dr. Cole in 2006.  Negative hep b, hep c, hemochromatosis, autoimmune, ceruloplasmin labs.  Us  9/06 negative       Past Medical History:   Diagnosis Date    Glaucoma     Hypertension      Past Surgical History:   Procedure Laterality Date    BREAST SURGERY       Allergies   Allergen Reactions    Amlodipine      Other reaction(s): Other  Edema      Current Outpatient Medications   Medication Sig    aspirin  (ASPIRIN  LOW DOSE) 81 mg EC tablet Take 1 tablet (81 mg total) by mouth daily.    atorvastatin  10 mg tablet Take 1 tablet (10 mg total) by mouth at bedtime.    cycloSPORINE  0.05% ophthalmic emulsion Place 1 drop into both eyes two (2) times daily.    latanoprost  0.005% ophthalmic solution Place 1 drop into both eyes every evening.    losartan -hydroCHLOROthiazide  100-25 mg tablet Take 1 tablet by mouth daily.    MIEBO  1.338 GM/ML SOLN Place 1 drop into both eyes four (4) times daily as needed (Dry eyes). (Patient taking differently: Place 1 drop into both eyes four (4) times daily as needed (Dry eyes). Would like refill)    prednisoLONE  acetate 1% ophthalmic suspension Place 2 drops into both eyes two (2) times daily.    [DISCONTINUED] ALBUTEROL  90 mcg/act inhaler INHALE 1 PUFF BY MOUTH EVERY 4 HOURS AS NEEDED FOR SHORTNESS OF BREATH OR WHEEZING    [DISCONTINUED] sodium sulfate-potassium sulfate-magnesium sulfate solution Take as directed    [DISCONTINUED] valACYclovir  1000 mg tablet Take 2 tablets (2,000 mg total) by mouth two (2) times daily as needed (for 1 day per episode).    albuterol  90 mcg/act inhaler Inhale 2 puffs every six (6) hours as needed for Wheezing or Shortness of Breath.    budesonide -formoterol  (BREYNA ) 80-4.5 mcg/act inhaler Inhale 2 puffs two (2) times daily. Rinse mouth after use    lidocaine -prilocaine  2.5-2.5% cream Apply topically daily as needed. To be used prior to lab/IV draws valACYclovir  1000 mg tablet Take 2 tablets (2,000 mg) by mouth every twelve (12) hours as needed (cold sore) for 1 day (total daily dose 4,000 mg) ASAP after symptom onset    [DISCONTINUED] budesonide -formoterol  (BREYNA ) 80-4.5 mcg/act inhaler Inhale 2 puffs two (2) times daily.    [DISCONTINUED] lidocaine  5% ointment Apply topically daily as needed. To be used prior to lab/IV draws     No current facility-administered medications for this visit.     Facility-Administered Medications Ordered in Other Visits   Medication Dose Route Frequency    [DISCONTINUED] sodium chloride  0.9% IV soln  10 mL/hr Intravenous Continuous     Medications:  reviewed medication list in the chart  Family History   Problem Relation Age of Onset    Coronary artery disease Father     No Known Problems Sister     No Known Problems Brother      Immunization History   Administered Date(s) Administered    COVID-19, mRNA, (Pfizer - Purple Cap) 30 mcg/0.3 mL 05/07/2019, 05/28/2019    Hepatitis B, unspecified formulation 07/05/2004    Td 04/14/2007    Tdap 06/10/2017       Social history  Social History     Tobacco Use   Smoking Status Never   Smokeless Tobacco Never     Social History     Substance and Sexual Activity   Alcohol Use Yes     Social History     Substance and Sexual Activity   Drug Use No     Social History     Socioeconomic History    Marital status: Divorced   Tobacco Use    Smoking status: Never    Smokeless tobacco: Never   Vaping Use    Vaping status: Never Used   Substance and Sexual Activity    Alcohol use: Yes    Drug use: No    Sexual activity: Not Currently   Other Topics Concern    1. Need Help Feeding Yourself? No    2. Need Help Getting Dressed? No    3. Need Help Using the Telephone? No    4. 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 Need Help Shopping for Groceries? No    6. Need Help Getting Places Beyond Walking Distance? (Bus, Taxi) No    7. Need Help Getting from Bed to Chair? No    8. Need Help Bathing or Showering? No    9. Need Help Taking your Medications? No    10.  Need Help Doing Moderately Strenuous Housework? (ex. Laundry) No    11. Need Help Driving? No    12. Need Help Getting to the Toilet? No    13. Need Help Walking Across the Road? (Includes [place], [place]) No    14. Need Help Preparing Meals? No    15. Need Help Shopping for Personal Items? (Toiletries, Medicines) No    16. Need Help Climbing a Flight of Stairs? No    17. Do you live with someone who assists you at home? No    18. Do you get help from family members or friends in your home? No    19. Do you employ someone to provide health related care or help you in your home? No    20. Do you provide care for a family member? No    21. Does your home have rugs in the hallway? No    22. Does your home have poor lighting? No    23. Does your home lack grab bars in the bathroom? No    24. Does your home lack handrails on the stairs? Yes    25. Have you noticed any hearing difficulties? No    26. Do you currently participate in any regular activity to improve or maintain your physical fitness? Yes    27. Do you always wear a seatbelt when you ride in a car? Yes    28. If you drink alcohol, do you drink more than 7 drinks per week or more than 3 drinks on any given day? No    29. Has anyone ever been  concerned about your drinking? No    Do you exercise at least a day, 3 or more days a week? Yes    Types of Exercise? (List in Comments) No    Do you follow a special diet? No    Vegan? No    Vegetarian? No    Pescatarian? No    Lactose Free? No    Gluten Free? No    Omnivore? 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    REVIEW OF SYSTEMS   14 point ROS performed and negative except per HPI noted above    PHYSICAL EXAM     Vitals:    09/10/23 1358   BP: 136/84 Pulse: 67   Resp: 17   Temp: 36.6 ?C (97.9 ?F)   TempSrc: Forehead   SpO2: 94%   Weight: 122 lb (55.3 kg)   Height: 5' (1.524 m)       General appearance: well appearing, no acute distress  Eyes: pupils equal and reactive, extraocular eye movements intact, conjunctivae not injected  Nose: patent, no discharge   Mouth: mucous membranes moist  Respiratory: +diffuse inspiratory and expiratory wheezing on auscultation in apical, posterior, and lateral lung fields bilaterally, symmetric air entry  Cardiovascular: normal rate, regular rhythm, normal S1, S2, no murmurs, rubs, clicks or gallops.   Gastrointestinal: soft, non-tender, non-distended  Musculoskeletal: moving all extremities  Skin: warm, dry, normal coloration, no rashes on exposed skin  Neurological: alert, oriented to person, place, time, and situation, normal speech  Psychiatric: cooperative, pleasant     LABS/IMAGING   Trends  Weight:   Wt Readings from Last 3 Encounters:   09/10/23 122 lb (55.3 kg)   09/09/23 121 lb (54.9 kg)   07/23/23 120 lb (54.4 kg)     BP readings:   BP Readings from Last 3 Encounters:   09/10/23 136/84   09/09/23 131/61   08/07/23 131/70       HbA1c  HbA1c:   Hgb A1c   Date Value Ref Range Status   10/23/2021 5.4 <5.7 % Final     Comment:     For patients with diabetes, an A1c less than (<) or equal (=) to 7.0% is recommended for most patients, however the goal may be higher or lower depending on age and/or other medical problems.   For a diagnosis of diabetes, A1c greater than (>) or equal(=) to 6.5% indicates diabetes; values between 5.7% and 6.4% may indicate an increased risk of developing diabetes.        Lipid panel (Total cholesterol, LDL, HDL, triglycerides)  ACC/AHA Cholesterol Management Guideline Recommendations:   High-intensity statin because of ASCVD diagnosis or LDL>=190mg /dL. Patient is only currently receiving a moderate-intensity statin.    ASCVD Risk Score    10-year ASCVD risk  N/A. Patient has ASCVD or LDL>=190 as of 2:49 PM on 90/95/7974.   10-year ASCVD risk with optimal risk factors  is 9.7%.   Values used to calculate ASCVD Risk Score    Age  49 y.o.     Gender  female   Race  White or Caucasian   HDL Cholesterol  64 mg/dL. (measured on 11/27/2022)   LDL Cholesterol  66 mg/dL. (measured on 11/27/2022)  Total Cholesterol  147 mg/dL. (measured on 11/27/2022)   Systolic Blood Pressure  136 mm Hg. (measured on 09/10/2023)   Blood Pressure Medication Present  Yes   Smoking Status  currently not a smoker   Diabetes Present  No     Click here for the [facility] ASCVD Cardiovascular Risk Estimator Plus tool Office manager).      Lab Results   Component Value Date    CHOL 147 11/27/2022    CHOLHDL 64 11/27/2022    CHOLDLCAL 66 11/27/2022    TRIGLY 84 11/27/2022       Chemistry panel  Lab Results   Component Value Date    NA 140 06/03/2023    K 3.9 06/03/2023    CL 107 (H) 06/03/2023    CO2 19 (L) 06/03/2023    BUN 14 06/03/2023    CREAT 0.54 (L) 06/03/2023    GLUCOSE 107 (H) 06/03/2023    CALCIUM  10.1 06/03/2023    ALT 30 11/27/2022    AST 44 11/27/2022    BILITOT 0.5 11/27/2022    ALKPHOS 57 11/27/2022    ALBUMIN 4.5 11/27/2022     No results found for: ''GFRESTNOAA'']  No results found for: ''GFRESTAA'']  Lab Results   Component Value Date    HGBA1C 5.4 10/23/2021     Lab Results   Component Value Date    ALT 30 11/27/2022    AST 44 11/27/2022    ALKPHOS 57 11/27/2022    BILITOT 0.5 11/27/2022     Lab Results   Component Value Date    CALCIUM  10.1 06/03/2023     Lab Results   Component Value Date    ALKPHOS 57 11/27/2022     No results found for: ''URICACID''    Complete blood count  Lab Results   Component Value Date    WBC 6.93 10/23/2021    HGB 17.4 (H) 10/23/2021    HCT 49.7 (H) 10/23/2021    MCV 94.1 10/23/2021    PLT 181 10/23/2021       Thyroid function  Lab Results   Component Value Date    TSH 1.2 10/23/2021       Imaging    Data/Studies independently Reviewed by me    ASSESSMENT & PLAN   Nina Cox is a 73 y.o. female here for   Chief Complaint   Patient presents with    Establish Care     Medication concern        1. Encounter to establish care with new doctor (Primary)  Initial encounter  - Extensively reviewed pertinent available records  - Follow-up for annual physical or sooner as needed   - Schedule follow-up annual physical around 12/26/2023. Coordinated care with other specialists. Ensured medication refills and lab orders prior to patient?s move to [place] .    2. Primary hypertension  Chronic, controlled/at goal <140 over  > 10/23/2021 Cr, GFR reassuring due for albumin to creatinine ratio   - Recommended to refill blood pressure medications for at least 3-6 months before relocation. Advised to pick up medications prior to move.  - Comprehensive Metabolic Panel; Future  - Hgb A1c; Future  - TSH with reflex FT4, FT3; Future  - Albumin/Creat Ratio Ur; Future    3. Mixed hyperlipidemia  Chronic; uncertain stability  > 10/23/2021 lipid panel at goal  - Ordered laboratory tests to monitor lipid levels. Advised to refill lipid-lowering medications for at least 3-6 months before relocation.  - Comprehensive Metabolic  Panel; Future  - Lipid Panel; Future    4. Moderate persistent asthma without complication  Chronic; suboptimally controlled  Moderate persistent asthma, suboptimally controlled with daily cough and frequent wheezing.  - Prescribed Breyna  (maintenance inhaler) 2 puffs twice daily.   - Refilled albuterol  inhaler for rescue use.   - Advised to use maintenance inhaler daily and albuterol  as needed. Discussed nebulizer as an option, but patient opted for inhaler refills.   - Recommended influenza, pneumococcal, and shingles vaccines due to increased risk, but patient deferred at this time.  - albuterol  90 mcg/act inhaler; Inhale 2 puffs every six (6) hours as needed for Wheezing or Shortness of Breath.  Dispense: 18 g; Refill: 6  - budesonide -formoterol  (BREYNA ) 80-4.5 mcg/act inhaler; Inhale 2 puffs two (2) times daily. Rinse mouth after use  Dispense: 10.3 g; Refill: 6    5. Recurrent cold sores  Chronic; currently asymptomatic  History most consistent with herpes labialis; given episodic flare-ups, may benefit from episodic treatment  - Refilled valacyclovir  (Valtrex ) for episodic use as needed for outbreaks.  - May use topical emollients such as Vaseline over area to help moisturize  - valACYclovir  1000 mg tablet; Take 2 tablets (2,000 mg) by mouth every twelve (12) hours as needed (cold sore) for 1 day (total daily dose 4,000 mg) ASAP after symptom onset  Dispense: 12 tablet; Refill: 3    6. Dry eyes  Chronic; ongoing  Dry eyes, no evidence of underlying rheumatologic disease.  - Will confirm with ophthalmologist regarding [place]  eye drop regimen and refill as appropriate once instructions are received.    7. Primary open angle glaucoma of both eyes, unspecified glaucoma stage  Chronic; ongoing  Likely experiencing symptoms of glaucoma, dry eyes, and although patient reports having macular degeneration, although describes peripheral vision loss rather than central does not seem very consistent, warrants specialist's evaluation  - Glaucoma under ophthalmologist management.  - Will coordinate with ophthalmologist for confirmation and refill of glaucoma eye drops. Will prescribe refills as soon as instructions are received    8. Anesthesia of skin  Cutaneous anesthesia requiring topical anesthetic for blood draws.  - Prescribed lidocaine -prilocaine  for use prior to blood draws.  - lidocaine -prilocaine  2.5-2.5% cream; Apply topically daily as needed. To be used prior to lab/IV draws  Dispense: 30 g; Refill: 1    9. Balance disorder  Chronic; ongoing  Unsteady balance, likely multifactorial, possibly age-related.  - Discussed physical therapy and balance training as potential interventions, but deferred referral due to imminent relocation.  - Discussed possible use of back brace, but not recommended for long-term use    10. Screening for diabetes mellitus  Diabetes screening indicated, no current diagnosis.  - Ordered laboratory tests including A1c and other metabolic panels.  - Comprehensive Metabolic Panel; Future  - Hgb A1c; Future  - CBC & Auto Differential; Future      Return in about 4 months (around 12/26/2023) for annual physical or earlier as needed. or sooner PRN  Future Appointments   Date Time Provider Department Center   09/10/2023  3:15 PM Herold Bernarda Brady [facility] CARE PR [place]   12/01/2023  1:30 PM [facility] [place]/   12/01/2023  2:00 PM Jenene Blossom, MD [facility] [place]/   02/09/2024  2:15 PM Cipriano Pama Emi LOISE., MD CARD [facility]       The above plan of care, diagnosis, orders, and follow-up were discussed with the patient.  Questions  related to this recommended plan of care were answered.    62 minutes were spent personally by me today on this encounter which include today's pre-visit review of the chart, obtaining appropriate history, performing an evaluation, documentation and discussion of management with details supported within the note for today's visit. The time documented was exclusive of any time spent on the separately billed procedure.    Signed,  Kameryn Davern T. Laurell, MD   Health Sciences Clinical Instructor  Department of Medicine  [place] [facility] [place] , [place]  09/10/2023

## 2023-09-11 LAB — Tissue Exam: LAB AP CASE REPORT: HIGH

## 2023-09-11 LAB — Gliadin-Deamidated IgA: GLIADIN (DEAMIDATED) AB, IGA (BIOFLASH): <20 CU (ref <20.0)

## 2023-09-11 LAB — Lipid Panel: CHOLESTEROL: 145 mg/dL (ref >50–<130)

## 2023-09-11 LAB — CBC: MEAN PLATELET VOLUME: 9.8 fL (ref 9.3–13.0)

## 2023-09-11 LAB — Comprehensive Metabolic Panel
ALBUMIN: 4.6 g/dL (ref 3.9–5.0)
ANION GAP: 15 mmol/L (ref 8–19)

## 2023-09-11 LAB — Gliadin-Deamidated IgG: GLIADIN (DEAMIDATED) AB, IGG (BIOFLASH): <20 CU (ref <20.0)

## 2023-09-11 LAB — TSH with reflex FT4, FT3: TSH: 0.9 u[IU]/mL (ref 0.3–4.7)

## 2023-09-11 LAB — Albumin/Creatinine Ratio,Urine: CREATININE,RANDOM URINE: 75.6 mg/dL

## 2023-09-11 LAB — IgA,Serum: IGA,SERUM: 546 mg/dL — ABNORMAL HIGH (ref 76–426)

## 2023-09-11 LAB — Transglutaminase IgA: TRANSGLUTAMINASE IGA (BIOFLASH): <20 CU (ref <20.0)

## 2023-09-11 LAB — Differential Automated: MONOCYTE PERCENT, AUTO: 10 % (ref 1.80–6.90)

## 2023-09-11 LAB — Hgb A1c: HGB A1C: 5.2 % HbA1c (ref <5.7)

## 2023-09-11 MED ORDER — MIEBO 1.338 GM/ML OP SOLN
1 [drp] | Freq: Four times a day (QID) | OPHTHALMIC | 3 refills | 15.50000 days | Status: AC | PRN
Start: 2023-09-11 — End: ?

## 2023-09-11 NOTE — Telephone Encounter
 OK'd for as needed use by Ophtho Dr. Jenene

## 2023-09-13 ENCOUNTER — Telehealth: Payer: PRIVATE HEALTH INSURANCE

## 2023-09-14 ENCOUNTER — Telehealth: Payer: PRIVATE HEALTH INSURANCE

## 2023-09-14 LAB — Endomysial IgA: ENDOMYSIAL IGA ANTIBODY: <1:10 {titer}

## 2023-09-14 NOTE — Telephone Encounter
 Spoke to patient and schedule video vist fo 09/14/23.

## 2023-09-14 NOTE — Telephone Encounter
 Correction: 09/15/23

## 2023-09-15 ENCOUNTER — Ambulatory Visit: Payer: PRIVATE HEALTH INSURANCE

## 2023-09-15 DIAGNOSIS — Z860101 History of adenomatous polyp of colon: Secondary | ICD-10-CM

## 2023-09-15 DIAGNOSIS — K52832 Lymphocytic colitis: Secondary | ICD-10-CM

## 2023-09-15 DIAGNOSIS — R195 Other fecal abnormalities: Principal | ICD-10-CM

## 2023-09-15 NOTE — Progress Notes
 Patient Consent to Telehealth Questionnaire        No data to display              - I agree  to be treated via a video visit and acknowledge that I may be liable for any relevant copays or coinsurance depending on my insurance plan.  - I understand that this video visit is offered for my convenience and I am able to cancel and reschedule for an in-person appointment if I desire.   - I also acknowledge that sensitive medical information may be discussed during this video visit appointment and that it is my responsibility to locate myself in a location that ensures privacy to my own level of comfort.  - I also acknowledge that I should not be participating in a video visit in a way that could cause danger to myself or to those around me (such as driving or walking).  If my provider is concerned about my safety, I understand that they have the right to terminate the visit.      [facility], [facility]  [address], [address]  [place], [place] [address]  Phone: [phone]  Fax: [phone]     Outpatient Gastroenterology Consult Note                                     PATIENT: Nina Cox  MRN: [id number]  DOB: 04-Feb-1950  DATE OF SERVICE: 09/15/2023    REFERRING PRACTITIONER: Cipriano Schiller, Pe*  PRIMARY CARE PROVIDER: Laurell Beola DASEN., MD    Patient Active Problem List   Diagnosis    Primary hypertension    Hyperlipidemia    Prediabetes    Nevus of choroid    Insufficiency, tear film    Chronic nonalcoholic liver disease    Primary open angle glaucoma (POAG) of both eyes    Hardening of the aorta (main artery of the heart)    Abnormal tomography of chest    Atherosclerosis of native coronary artery of native heart    Aortic regurgitation    Noncompliance with medication regimen    Glaucoma suspect of both eyes    Pseudophakia, both eyes    Chronic diarrhea    Occult blood positive stool    Recurrent cold sores    Moderate persistent asthma without complication    Dry eyes       Chief Complaint:   No chief complaint on file.  FIT  Positive    HPI:     Nina Cox is a 73 y.o. female with hypertension presents for    09/15/2023  History of Present Illness    - History of chronic diarrhea prior to colonoscopy  - Reports improvement in diarrhea symptoms since colonoscopy  - Taking baby aspirin  intermittently for high blood pressure  - Taking glucosamine for knee issues  - Takes multiple vitamins and supplements  - Denies use of NSAIDs (ibuprofen, Motrin, Aleve) and antacid medications (omeprazole, pantoprazole)        07/23/2023  6-8xday bm. Loose stools. Occ nocturnal sx. Ongoing for last 79yrs.   She was fit positive in 2023 and has not scheduled colonoscopy. No previous colonoscopy    No GERD, dysphagia, odynophagia, unintended weight loss, abdpain, brbpr, melena, f/c/n/v, NSAIDs.    Past Medical History:  Past Medical History:   Diagnosis Date    Glaucoma     Hypertension  Past Surgical History:  Past Surgical History:   Procedure Laterality Date    BREAST SURGERY         Allergies:  Allergies   Allergen Reactions    Amlodipine      Other reaction(s): Other  Edema        Current Meds Reviewed:  Current Outpatient Medications   Medication Sig    albuterol  90 mcg/act inhaler Inhale 2 puffs every six (6) hours as needed for Wheezing or Shortness of Breath.    aspirin  (ASPIRIN  LOW DOSE) 81 mg EC tablet Take 1 tablet (81 mg total) by mouth daily.    atorvastatin  10 mg tablet Take 1 tablet (10 mg total) by mouth at bedtime.    budesonide -formoterol  (BREYNA ) 80-4.5 mcg/act inhaler Inhale 2 puffs two (2) times daily. Rinse mouth after use    cycloSPORINE  0.05% ophthalmic emulsion Place 1 drop into both eyes two (2) times daily.    latanoprost  0.005% ophthalmic solution Place 1 drop into both eyes every evening.    lidocaine -prilocaine  2.5-2.5% cream Apply topically daily as needed. To be used prior to lab/IV draws    losartan -hydroCHLOROthiazide  100-25 mg tablet Take 1 tablet by mouth daily. MIEBO  1.338 GM/ML SOLN Place 1 drop into both eyes four (4) times daily as needed (Dry eyes).    prednisoLONE  acetate 1% ophthalmic suspension Place 2 drops into both eyes two (2) times daily.    valACYclovir  1000 mg tablet Take 2 tablets (2,000 mg) by mouth every twelve (12) hours as needed (cold sore) for 1 day (total daily dose 4,000 mg) ASAP after symptom onset    [DISCONTINUED] ALBUTEROL  90 mcg/act inhaler INHALE 1 PUFF BY MOUTH EVERY 4 HOURS AS NEEDED FOR SHORTNESS OF BREATH OR WHEEZING    [DISCONTINUED] budesonide -formoterol  (BREYNA ) 80-4.5 mcg/act inhaler Inhale 2 puffs two (2) times daily.    [DISCONTINUED] lidocaine  5% ointment Apply topically daily as needed. To be used prior to lab/IV draws    [DISCONTINUED] sodium sulfate-potassium sulfate-magnesium sulfate solution Take as directed    [DISCONTINUED] valACYclovir  1000 mg tablet Take 2 tablets (2,000 mg total) by mouth two (2) times daily as needed (for 1 day per episode).     No current facility-administered medications for this visit.     Facility-Administered Medications Ordered in Other Visits   Medication Dose Route Frequency    [DISCONTINUED] lidocaine  PF 1% inj   Intravenous PRN    [DISCONTINUED] propofol  200 mg/20 mL inj   Intravenous PRN    [DISCONTINUED] propofol  200 mg/20 mL inj   Intravenous Continuous PRN    [DISCONTINUED] sodium chloride  0.9% IV soln  10 mL/hr Intravenous Continuous    [DISCONTINUED] sodium chloride  0.9% IV soln   Intravenous Continuous PRN        Social History:  Social History     Socioeconomic History    Marital status: Divorced   Tobacco Use    Smoking status: Never    Smokeless tobacco: Never   Vaping Use    Vaping status: Never Used   Substance and Sexual Activity    Alcohol use: Yes    Drug use: No    Sexual activity: Not Currently   Other Topics Concern    1. Need Help Feeding Yourself? No    2. Need Help Getting Dressed? No    3. Need Help Using the Telephone? No    4. 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 Need Help Shopping for Groceries? No  6. Need Help Getting Places Beyond Walking Distance? (Bus, Taxi) No    7. Need Help Getting from Bed to Chair? No    8. Need Help Bathing or Showering? No    9. Need Help Taking your Medications? No    10.  Need Help Doing Moderately Strenuous Housework? (ex. Laundry) No    11. Need Help Driving? No    12. Need Help Getting to the Toilet? No    13. Need Help Walking Across the Road? (Includes [place], [place]) No    14. Need Help Preparing Meals? No    15. Need Help Shopping for Personal Items? (Toiletries, Medicines) No    16. Need Help Climbing a Flight of Stairs? No    17. Do you live with someone who assists you at home? No    18. Do you get help from family members or friends in your home? No    19. Do you employ someone to provide health related care or help you in your home? No    20. Do you provide care for a family member? No    21. Does your home have rugs in the hallway? No    22. Does your home have poor lighting? No    23. Does your home lack grab bars in the bathroom? No    24. Does your home lack handrails on the stairs? Yes    25. Have you noticed any hearing difficulties? No    26. Do you currently participate in any regular activity to improve or maintain your physical fitness? Yes    27. Do you always wear a seatbelt when you ride in a car? Yes    28. If you drink alcohol, do you drink more than 7 drinks per week or more than 3 drinks on any given day? No    29. Has anyone ever been concerned about your drinking? No    Do you exercise at least a day, 3 or more days a week? Yes    Types of Exercise? (List in Comments) No    Do you follow a special diet? No    Vegan? No    Vegetarian? No    Pescatarian? No    Lactose Free? No    Gluten Free? No    Omnivore? 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 Divorce lawyer    Family History:  Family History   Problem Relation Age of Onset    Coronary artery disease Father     No Known Problems Sister     No Known Problems Brother         No GI cancers, Liver cancer/disease    Objective:     Physical Exam:    Vitals:LMP  (LMP Unknown)            Constitutional: Well developed, well nourished, alert, cooperative, and in no acute distress   Head: Normocephalic, without obvious abnormality, atraumatic   Eyes: Anicteric sclera.     Neck: Symmetric, Range of motion intact   Lungs: Equal rise, breathing comfortably on room air   Skin: Skin color, texture, turgor normal. No rashes or lesions   Neurological: Alert, normal affect    Labs Reviewed:  Lab Results   Component Value Date    NA 142 09/10/2023    K 3.9 09/10/2023    CL  104 09/10/2023    CO2 23 09/10/2023    BUN 19 09/10/2023    CREAT 0.74 09/10/2023    CALCIUM  10.3 09/10/2023     Lab Results   Component Value Date    WBC 7.52 09/10/2023    HGB 17.1 (H) 09/10/2023    HCT 51.5 (H) 09/10/2023    MCV 99.4 (H) 09/10/2023    PLT 179 09/10/2023    INR 1.0 10/23/2021    APTT 32.3 10/23/2021     Lab Results   Component Value Date    AST 46 09/10/2023    ALT 37 09/10/2023    ALKPHOS 36 (L) 09/10/2023    BILITOT 0.8 09/10/2023    ALBUMIN 4.6 09/10/2023     No results found for: ''SRWEST'', ''CRP''  No results found for: ''IRON'', ''TIBC'', ''FERRITIN''    Imaging Reviewed:      GI Studies Reviewed:      MDM  Number and Complexity of Problems Addressed at the Encounter:   []   1 or more chronic illness with exacerbation, progression, or side effects of treatment  [x]   2 or more stable chronic illness  []   1 undiagnosed new problem with uncertain diagnosis  []   1 acute illness with systemic symptoms  []   1 acute complicated injury     Review of Data: I have  []  Reviewed/ordered >= 3 unique laboratory, radiology, and/or diagnostic tests noted    []  Reviewed prior external notes and incorporated into patient assessment as noted  []  I have independently interpreted test performed by other physician(s) as noted   []  Discussed management or test interpretation with external provider(s) as noted     Risk of Complication and or Morbidity or Mortality of Patient Management including Social Determinants of Health:   [x]   I deem the above diagnoses to have a risk of complication, morbidity or mortality of moderate   []  The diagnosis or treatment of said conditions is significantly limited by social determinants of health as noted.   09/09/23 COLONOSCOPY Procedure Report  Rectal exam was normal.  The terminal ileum, cecum, Ileocecal valve, appendiceal orifice, ascending colon, transverse colon, descending colon,  sigmoid colon and rectum appeared normal. Random right and left colon biopsies done with cold forceps to evaluate for  microscopic colitis.  Retroflexion exam showed small internal hemorrhoids.  5mm sessile polyp in the transverse colon successfully removed with cold snare.  8mm sessile polyp in the rectum successfully removed with cold snare.  10mm submucosal nodule in the cecum biopsied.  Diverticulosis    A. LARGE INTESTINE, RIGHT COLON (BIOPSY):  - Lymphocytic colitis     B. LARGE INTESTINE, SUBMUCOSAL NODULE (BIOPSY):  - Lymphocytic colitis  - Minimal unremarkable submucosal tissue with a benign lymphoid aggregate  - Deeper levels examined     C. LARGE INTESTINE, TRANSVERSE COLON POLYP (POLYPECTOMY):  - Tubular adenoma  - No high-grade dysplasia     D. LARGE INTESTINE, LEFT COLON (BIOPSY):  - Lymphocytic colitis     E. LARGE INTESTINE, RECTAL POLYP (POLYPECTOMY):  - Fragments of tubular adenoma  - No high-grade dysplasia  Assessment/Plan:   Nina Cox is a 74 y.o. female with hypertension presents for      #Diarrhea   #microscopic colitis colonoscopy showed lymphocytic colitis  -she will discuss risks and benefits aspirin  w Dr. Pama. Also on statin. Avoid NSAIDs  -avoid PPI and nonessential natural supplements  -Take pepto bismol. This can make your stool look black so do  not be alarmed if you see this change with usage.   -take loperamide as needed. Initial: 4 mg, followed by 2 mg after each loose stool (maximum: 16 mg/day). Alternatively, loperamide 2 mg 45 minutes before a meal on regularly scheduled doses.   -discuss can consider steroid treatment if above not helping or if severe symptoms    #history of adenomatous polyp of the colon.  -colonoscopy in 3 years    #heavy alcohol use.  -avoid heavy alcohol  -ultrasound with elastography    Return to clinic p.r.n.    Thank you for allowing me the opportunity to participate in the care of your patient.  Please do not hesitate to call me with any questions.      Author: Tanda Levi Drum, MD 09/15/2023 2:49 PM    Portions of this note may have been created with voice recognition software. Occassional wrong-word or ''sound-alike'' substitutions may have occurred due to the inherent limitations of voice recognition software. Please read the chart carefully and recognize, using context, where these substitutions have occurred. The above plan/recommendation(s) were discussed with the patient. The patient had all questions answered satisfactorily and is in agreement with this recommended plan of care.        Ambient Scribe Use Consent: This note was generated using assisted transcription with the verbal consent of the patient/guardian. The note has been reviewed and edited accordingly by me.

## 2023-09-16 LAB — Celiac Genetics: DQB1 LOCI IR1: 5

## 2023-09-17 ENCOUNTER — Telehealth: Payer: PRIVATE HEALTH INSURANCE

## 2023-09-17 DIAGNOSIS — D751 Secondary polycythemia: Principal | ICD-10-CM

## 2023-09-17 NOTE — Telephone Encounter
 I spent a total of 5 minutes with this established patient cumulatively within the 7 days of the MyChart message date.   Topics of my discussion, including but not limited to evaluation/assessment/management of the patient complaint are noted in the MyChart message exchange(s).    Total time spent may include providing evaluation and advice, responding to the patient, documenting the encounter, placing orders, and reviewing relevant labs and/or prior notes, if applicable.     1. Erythrocytosis      With persistent erythrocytosis on recent lab and year prior.  Given age, warrants further evaluation, thus referred to Heme-Onc as below    Orders Placed This Encounter    Referral to Hematology       Raissa Dam T. Brandyce Dimario  09/17/2023

## 2023-09-18 ENCOUNTER — Ambulatory Visit: Payer: PRIVATE HEALTH INSURANCE

## 2023-11-13 ENCOUNTER — Other Ambulatory Visit: Payer: PRIVATE HEALTH INSURANCE

## 2023-12-01 ENCOUNTER — Ambulatory Visit: Payer: PRIVATE HEALTH INSURANCE

## 2023-12-01 DIAGNOSIS — H401130 Primary open-angle glaucoma, bilateral, stage unspecified: Principal | ICD-10-CM

## 2023-12-01 DIAGNOSIS — H04123 Dry eye syndrome of bilateral lacrimal glands: Secondary | ICD-10-CM

## 2023-12-01 NOTE — Assessment & Plan Note
 Has been followed up at [facility] before  Has been on Latanoprost  at bedtime both eyes , ran out of the drops    Baseline oct nerve fiber layer thinning left eye   Continue present management  Follow up with intraocular pressure check and testing

## 2023-12-01 NOTE — Assessment & Plan Note
 Done by outside provider. Overall doing well. Monitor.

## 2023-12-01 NOTE — Assessment & Plan Note
 See POAG

## 2023-12-01 NOTE — Assessment & Plan Note
 Has been on restasis  twice a day  Rn out of  Will restart and use artificial tears twice a day

## 2023-12-01 NOTE — Assessment & Plan Note
 Asymptomatic, discussed can use artificial tears as needed and warm compress if becomes symptomatic.    Some of the more known brands of artificial tears are Systane, Refresh and Blink.

## 2023-12-12 MED ORDER — MIEBO 1.338 GM/ML OP SOLN: Start: 2023-12-12 — End: ?

## 2023-12-14 MED ORDER — MIEBO 1.338 GM/ML OP SOLN
OPHTHALMIC | 6 refills | 40.00000 days | Status: AC
Start: 2023-12-14 — End: ?

## 2023-12-27 MED ORDER — LOSARTAN POTASSIUM-HCTZ 100-25 MG PO TABS
1 | ORAL_TABLET | Freq: Every day | ORAL | 3 refills
Start: 2023-12-27 — End: ?

## 2023-12-28 MED ORDER — LOSARTAN POTASSIUM-HCTZ 100-25 MG PO TABS
1 | ORAL_TABLET | Freq: Every day | ORAL | 1 refills | 90.00000 days | Status: AC
Start: 2023-12-28 — End: ?

## 2024-01-27 ENCOUNTER — Telehealth: Payer: PRIVATE HEALTH INSURANCE

## 2024-01-27 NOTE — Telephone Encounter
 Message to Practice/Provider      Message: Patient moved to [place]  and would like to have her medical records sent to Dr. Catheline, [place].    Fax #[phone]    Return call is not being requested by the patient or caller.    Patient or caller has been notified that their message will be reviewed within 1-2 business days.

## 2024-01-27 NOTE — Telephone Encounter
 Hello Monique,    Not sure if you need to forward to anyone to fax records to an out of state doctor

## 2024-01-28 NOTE — Telephone Encounter
 Faxed notes per request to the number provided below.

## 2024-02-02 NOTE — Progress Notes
 PRIMARY CARE PHYSICIAN: Laurell Beola DASEN., MD    OUTPATIENT CARDIOLOGY FOLLOW UP PROGRESS NOTE    Subjective: Nina Cox is a pleasant 74 year old female with history of coronary artery disease, uncontrolled hypertension and dyslipidemia presents to Cardiology Clinic for follow up visit.  During the last visit I increased her losartan  to 100 mg along with the hydrochlorothiazide  25 mg daily.  Her blood pressure is well controlled now. She has been under stress daily.  Prior she had episode of chest discomfort.  These episodes are not necessarily exertional and lasts for a few minutes.  As a result we got a coronary CT angiogram which showed nonobstructive CAD detailed below.   I also put her on atorvastatin  and aspirin  but she has not not been taking her atorvastatin  regularly. She denies having any shortness of breath, palpitations, orthopnea, presyncope or syncope.     Review of Systems:  Gen: no fevers, chills, or night sweats.  HEENT: no acute visual or hearing changes; no new oral lesions or excess secretions.  Positive for chronic cough  CV: no chest pain, orthopnea, PND, palpitations, or syncope.  Resp: no SOB, DOE, cough, or hemoptysis.  GI: no constipation, hematochezia, or melena.  Positive for chronic diarrhea  GU: no dysuria, urgency, or hematuria.  Musc: no acute joint swelling or pain.  Skin: no acute rashes.  Neuro: no acute numbness, weakness, or seizures.  Psych: no anxiety or depression.  Endocrine: no heat or cold intolerance.  Heme/Lymph: no spontaneous bruising, bleeding, or lymph node swelling.  Allergic/Immunologic: no acute allergic reactions or hives.    Past Medical History:   Diagnosis Date    Glaucoma     Hypertension        Past Surgical History:   Procedure Laterality Date    BREAST SURGERY         Family History   Problem Relation Age of Onset    Coronary artery disease Father     No Known Problems Sister     No Known Problems Brother        Social History     Tobacco Use Smoking status: Never    Smokeless tobacco: Never   Vaping Use    Vaping status: Never Used   Substance Use Topics    Alcohol use: Yes    Drug use: No       Allergies   Allergen Reactions    Amlodipine      Other reaction(s): Other  Edema      Outpatient Medications Prior to Visit   Medication Sig    albuterol  90 mcg/act inhaler Inhale 2 puffs every six (6) hours as needed for Wheezing or Shortness of Breath.    aspirin  (ASPIRIN  LOW DOSE) 81 mg EC tablet Take 1 tablet (81 mg total) by mouth daily.    atorvastatin  10 mg tablet Take 1 tablet (10 mg total) by mouth at bedtime.    budesonide -formoterol  (BREYNA ) 80-4.5 mcg/act inhaler Inhale 2 puffs two (2) times daily. Rinse mouth after use    cycloSPORINE  0.05% ophthalmic emulsion Place 1 drop into both eyes two (2) times daily.    latanoprost  0.005% ophthalmic solution Place 1 drop into both eyes every evening.    lidocaine -prilocaine  2.5-2.5% cream Apply topically daily as needed. To be used prior to lab/IV draws    losartan -hydroCHLOROthiazide  100-25 mg tablet TAKE 1 TABLET BY MOUTH DAILY    MIEBO  1.338 GM/ML SOLN INSTILL 1 DROP IN BOTH EYES FOUR TIMES DAILY  AS NEEDED FOR DRY EYES    prednisoLONE  acetate 1% ophthalmic suspension Place 2 drops into both eyes two (2) times daily.    valACYclovir  1000 mg tablet Take 2 tablets (2,000 mg) by mouth every twelve (12) hours as needed (cold sore) for 1 day (total daily dose 4,000 mg) ASAP after symptom onset     No facility-administered medications prior to visit.       EXAM:  LMP  (LMP Unknown)       08/07/2023     4:13 PM 09/09/2023    12:42 PM 09/09/2023     1:54 PM 09/09/2023     2:02 PM 09/09/2023     2:10 PM 09/09/2023     2:26 PM 09/10/2023     1:58 PM   Vitals - 1 value per visit   SYSTOLIC 131 144 881 125 132 131 136   DIASTOLIC 70 83 50 57 56 61 84   Heart Rate 76 68 51 51 53 56 67   Temp  36.7 ?C (98 ?F)     36.6 ?C (97.9 ?F)   Resp 16 14 19 17 17 16 17    Weight (lb) -- 121     122   Height 5' (1.524 m) 5' 1'' (1.549 m)     5' (1.524 m)   BMI 23.44 kg/m2 22.86 kg/m2     23.83 kg/m2   SpO2 95 % 97 % 100 % 99 % 99 % 99 % 94 %   Visit Report Report      Report       GEN: awake and alert x3, no acute distress. Able to converse.  HEENT: PERRL, EOMI,moist conjunctivae, oral cavity clear; ears and nose atraumatic.   NECK: No significant JVD; +2 carotids bilaterally with no significant bruits.   HEART: RRR with normal S1 and S2, unchanged II/VI systolic and diastolic murmur noted  LUNGS: CTAB bilaterally with no rales or wheezes. Normal respiratory effort.  ABD: soft, NT, ND, +BS.   EXT: non-tender with no edema   MUSC: no joint swelling or effusions with normal range of movement of upper and lower extremities.  SKIN: no acute rashes.    ASCVD Risk Estimation  18.0% is the estimated 10-year risk of atherosclerotic cardiovascular disease (ASCVD) as of 10:36 PM on 02/02/2024  Values used to calculate ASCVD score:  Age: 74 y.o.    Gender: Female Race: Not African American.  70 mg/dL. (measured on 09/10/2023)  145 mg/dL. (measured on 09/10/2023)  136 mm Hg. (measured on 09/10/2023)  Yes  currently not a smoker  No  Click here for the 2013 ACC/AHA Cholesterol Treatment Guideline Summary (PDF).  Click here for the 2013 ACC/AHA Cardiovascular Risk Estimator tool office manager).    Lab Results   Component Value Date    WBC 7.52 09/10/2023    HGB 17.1 (H) 09/10/2023    HCT 51.5 (H) 09/10/2023    MCV 99.4 (H) 09/10/2023    PLT 179 09/10/2023     Lab Results   Component Value Date    CREAT 0.74 09/10/2023    BUN 19 09/10/2023    NA 142 09/10/2023    K 3.9 09/10/2023    CL 104 09/10/2023    CO2 23 09/10/2023     Lab Results   Component Value Date    HGBA1C 5.2 09/10/2023     Lab Results   Component Value Date    ALT 37 09/10/2023    AST 46 09/10/2023  ALKPHOS 36 (L) 09/10/2023    BILITOT 0.8 09/10/2023     Lab Results   Component Value Date    TSH 0.90 09/10/2023     Lab Results   Component Value Date    CALCIUM  10.3 09/10/2023     Lab Results   Component Value Date    CHOL 145 09/10/2023    CHOLHDL 70 09/10/2023    CHOLDLCAL 64 09/10/2023    TRIGLY 57 09/10/2023     Chest CT 08/24/2020: personally reviewed and interpreted, calcified plaques seen in the aorta and left main/ostial LAD      ECG 10/22/2021:  personally reviewed and interpreted. NSR, VR 69 bpm, PR interval 176 ms, QRS duration 80 ms, QTc 441 ms.      Coronary CTA 02/26/2022: personally reviewed and interpreted.      IMPRESSION: Good-quality CT coronary angiogram with no non evaluable segments.  Calcified plaque at the proximal LAD resulting in mild (under 50%) stenosis. Otherwise, the coronary artery anatomy is conventional without high-grade stenosis.  Peripherally calcified bilateral breast prostheses. Unchanged ovoid soft tissue nodule posterior the left silicone implant with internal calcifications. While this finding is unchanged from 2022 prior, consider follow-up with dedicated breast imaging for further evaluation.     Echocardiogram 03/14/2022: personally reviewed and interpreted.    CONCLUSIONS   1. Normal left ventricular size, wall thickness and function (EF 70%).   2. Normal right ventricular size and systolic function.   3. Mild aortic regurgitation.   4. There are no prior studies on this patient for comparison purposes.    ECG 01/28/2023: personally reviewed and interpreted. NSR with LAFB, can not rule out anterior infarct VR 85 bpm, PR interval 162 ms, QRS duration 84 ms, QTc 447 ms.    Echocardiogram 06/03/2023:  personally reviewed and interpreted.   CONCLUSIONS   1. Normal left ventricular size and function (EF 61%).   2. Mild concentric left ventricular hypertrophy.   3. Normal right ventricular size and systolic function.   4. Trivial aortic regurgitation otherwise no significant valvular abnormalities..   5. PA systolic pressure cannot be calculated due to faint TR jet.   6. Compared to prior study on 03/14/2022, mild LVH is not present otherwise no significant changes.    ECG today 02/09/2024: Performed in clinic and personally reviewed and interpreted. NSR, VR bpm, PR interval ms, QRS duration ms, QTc ms.      ASSESSMENT: Nina Cox is a pleasant 74 year old female with history of coronary artery disease, uncontrolled hypertension and dyslipidemia presents to Cardiology Clinic for follow up visit.      PLAN:  #Hypertension:  Now well controlled, I rechecked her blood pressure after a few minutes of resting and was still elevated.  Now mild LVH on echocardiogram  - continue losartan  to 100 mg daily, this was increased during the last visit,  - continue hydrochlorothiazide  25 mg daily  - continue home blood pressure monitoring instructed  - will check a basic metabolic panel to monitor kidney function and electrolytes    #Coronary artery disease:  Seen on chest CT, nonobstructive  - continue aspirin  81 mg daily, and she can take it 3 times a week with any bruising  - continue atorvastatin  10 mg q.h.s regularly.  A refill was sent to her pharmacy    #Dyslipidemia: LDL 79 -> 70 -> 66  - agreed to take atorvastatin  10 mg q.h.s. regularly, a refill was sent to her pharmacy  - will continue  to monitor    #Aortic Regurgitation:  New diagnosis.  Discussed with the patient in detail  - will make sure her blood pressure is well controlled and will repeat her echocardiogram to monitor prior to her next visit    #Chronic cough:  Nonproductive  - not on ACE inhibitors  - PFTs were normal in 2023  - she has been sent back to Pulmonary for further evaluation  - she was advised to try antacid medication as a trial    #Follow up:  6 months from now before her move to [place]     Thank you for the opportunity to continue to participate in your patient's care. 46 minutes were spent personally by me today on this encounter which include today's pre-visit review of chart, obtaining appropriate history, performing an evaluation, documentation and discussion of management with details supported within the note for today's visit. The time documented was exclusive of any time spent of the separately billed procedure.         Emi Schiller, MD  [facility] [employer]  Dept. of Medicine, Div. of Cardiology   [facility]  [address],   [place], [place]  [address]
